# Patient Record
Sex: Female | Born: 1940
Health system: Southern US, Community
[De-identification: ages and names within clinical notes are randomized; demographics above are authoritative.]

## PROBLEM LIST (undated history)

## (undated) DIAGNOSIS — T7840XA Allergy, unspecified, initial encounter: Secondary | ICD-10-CM

## (undated) DIAGNOSIS — N200 Calculus of kidney: Secondary | ICD-10-CM

## (undated) HISTORY — PX: BUNIONECTOMY: SHX129

## (undated) HISTORY — DX: Calculus of kidney: N20.0

## (undated) HISTORY — PX: CATARACT EXTRACTION: SUR2

## (undated) HISTORY — PX: ABDOMINAL HYSTERECTOMY: SHX81

## (undated) HISTORY — PX: TONSILLECTOMY: SUR1361

## (undated) HISTORY — DX: Allergy, unspecified, initial encounter: T78.40XA

---

## 2000-03-23 ENCOUNTER — Encounter: Payer: Self-pay | Admitting: Family Medicine

## 2000-03-23 ENCOUNTER — Encounter: Admission: RE | Admit: 2000-03-23 | Discharge: 2000-03-23 | Payer: Self-pay | Admitting: Family Medicine

## 2000-08-28 ENCOUNTER — Encounter: Payer: Self-pay | Admitting: Family Medicine

## 2000-08-28 ENCOUNTER — Encounter: Admission: RE | Admit: 2000-08-28 | Discharge: 2000-08-28 | Payer: Self-pay | Admitting: Family Medicine

## 2001-08-30 ENCOUNTER — Encounter: Admission: RE | Admit: 2001-08-30 | Discharge: 2001-08-30 | Payer: Self-pay | Admitting: Family Medicine

## 2001-08-30 ENCOUNTER — Encounter: Payer: Self-pay | Admitting: Family Medicine

## 2002-09-03 ENCOUNTER — Encounter: Payer: Self-pay | Admitting: Family Medicine

## 2002-09-03 ENCOUNTER — Encounter: Admission: RE | Admit: 2002-09-03 | Discharge: 2002-09-03 | Payer: Self-pay | Admitting: Family Medicine

## 2003-09-08 ENCOUNTER — Encounter: Admission: RE | Admit: 2003-09-08 | Discharge: 2003-09-08 | Payer: Self-pay | Admitting: Family Medicine

## 2004-04-15 ENCOUNTER — Encounter: Admission: RE | Admit: 2004-04-15 | Discharge: 2004-04-15 | Payer: Self-pay | Admitting: Family Medicine

## 2004-04-19 ENCOUNTER — Encounter: Admission: RE | Admit: 2004-04-19 | Discharge: 2004-04-19 | Payer: Self-pay | Admitting: Family Medicine

## 2004-05-25 ENCOUNTER — Ambulatory Visit: Payer: Self-pay | Admitting: Family Medicine

## 2004-08-06 ENCOUNTER — Ambulatory Visit: Payer: Self-pay | Admitting: Family Medicine

## 2004-09-07 ENCOUNTER — Ambulatory Visit: Payer: Self-pay | Admitting: Family Medicine

## 2004-09-16 ENCOUNTER — Encounter: Admission: RE | Admit: 2004-09-16 | Discharge: 2004-09-16 | Payer: Self-pay | Admitting: Family Medicine

## 2004-09-17 ENCOUNTER — Ambulatory Visit: Payer: Self-pay | Admitting: Family Medicine

## 2005-01-08 ENCOUNTER — Emergency Department (HOSPITAL_COMMUNITY): Admission: EM | Admit: 2005-01-08 | Discharge: 2005-01-09 | Payer: Self-pay | Admitting: Emergency Medicine

## 2005-01-12 ENCOUNTER — Ambulatory Visit: Payer: Self-pay | Admitting: Family Medicine

## 2005-01-12 ENCOUNTER — Ambulatory Visit: Payer: Self-pay | Admitting: Cardiology

## 2005-01-12 ENCOUNTER — Encounter (INDEPENDENT_AMBULATORY_CARE_PROVIDER_SITE_OTHER): Payer: Self-pay | Admitting: *Deleted

## 2005-01-13 ENCOUNTER — Ambulatory Visit: Payer: Self-pay | Admitting: Family Medicine

## 2005-01-17 ENCOUNTER — Ambulatory Visit: Payer: Self-pay | Admitting: Internal Medicine

## 2005-02-21 ENCOUNTER — Ambulatory Visit: Payer: Self-pay | Admitting: Internal Medicine

## 2005-03-03 ENCOUNTER — Ambulatory Visit: Payer: Self-pay | Admitting: Internal Medicine

## 2005-03-17 ENCOUNTER — Ambulatory Visit: Payer: Self-pay | Admitting: Family Medicine

## 2005-09-19 ENCOUNTER — Encounter: Admission: RE | Admit: 2005-09-19 | Discharge: 2005-09-19 | Payer: Self-pay | Admitting: Family Medicine

## 2005-09-19 ENCOUNTER — Ambulatory Visit: Payer: Self-pay | Admitting: Family Medicine

## 2006-03-16 ENCOUNTER — Ambulatory Visit: Payer: Self-pay | Admitting: Family Medicine

## 2006-03-20 ENCOUNTER — Ambulatory Visit: Payer: Self-pay | Admitting: Internal Medicine

## 2006-04-26 ENCOUNTER — Ambulatory Visit: Payer: Self-pay | Admitting: Family Medicine

## 2006-04-28 ENCOUNTER — Encounter: Admission: RE | Admit: 2006-04-28 | Discharge: 2006-04-28 | Payer: Self-pay | Admitting: Otolaryngology

## 2006-07-03 ENCOUNTER — Ambulatory Visit: Payer: Self-pay | Admitting: Family Medicine

## 2006-08-08 ENCOUNTER — Ambulatory Visit: Payer: Self-pay | Admitting: Family Medicine

## 2006-09-22 ENCOUNTER — Encounter: Admission: RE | Admit: 2006-09-22 | Discharge: 2006-09-22 | Payer: Self-pay | Admitting: Family Medicine

## 2006-10-03 ENCOUNTER — Encounter: Admission: RE | Admit: 2006-10-03 | Discharge: 2006-10-03 | Payer: Self-pay | Admitting: Family Medicine

## 2007-02-23 ENCOUNTER — Ambulatory Visit: Payer: Self-pay | Admitting: Family Medicine

## 2007-02-28 DIAGNOSIS — G43909 Migraine, unspecified, not intractable, without status migrainosus: Secondary | ICD-10-CM | POA: Insufficient documentation

## 2007-02-28 DIAGNOSIS — J309 Allergic rhinitis, unspecified: Secondary | ICD-10-CM | POA: Insufficient documentation

## 2007-02-28 DIAGNOSIS — M810 Age-related osteoporosis without current pathological fracture: Secondary | ICD-10-CM | POA: Insufficient documentation

## 2007-03-20 ENCOUNTER — Ambulatory Visit: Payer: Self-pay | Admitting: Family Medicine

## 2007-03-20 LAB — CONVERTED CEMR LAB
ALT: 21 units/L (ref 0–35)
Albumin: 4.2 g/dL (ref 3.5–5.2)
Alkaline Phosphatase: 59 units/L (ref 39–117)
BUN: 15 mg/dL (ref 6–23)
Basophils Relative: 0.5 % (ref 0.0–1.0)
CO2: 31 meq/L (ref 19–32)
Calcium: 9.5 mg/dL (ref 8.4–10.5)
Cholesterol: 233 mg/dL (ref 0–200)
Eosinophils Relative: 2 % (ref 0.0–5.0)
GFR calc Af Amer: 129 mL/min
GFR calc non Af Amer: 106 mL/min
Hemoglobin: 12.4 g/dL (ref 12.0–15.0)
Lymphocytes Relative: 20.1 % (ref 12.0–46.0)
Monocytes Relative: 6.6 % (ref 3.0–11.0)
Neutrophils Relative %: 70.8 % (ref 43.0–77.0)
VLDL: 21 mg/dL (ref 0–40)

## 2007-03-21 ENCOUNTER — Telehealth: Payer: Self-pay | Admitting: Family Medicine

## 2007-03-22 ENCOUNTER — Encounter: Payer: Self-pay | Admitting: Family Medicine

## 2007-05-03 ENCOUNTER — Ambulatory Visit: Payer: Self-pay | Admitting: Family Medicine

## 2007-10-03 ENCOUNTER — Encounter: Admission: RE | Admit: 2007-10-03 | Discharge: 2007-10-03 | Payer: Self-pay | Admitting: Family Medicine

## 2007-10-17 ENCOUNTER — Ambulatory Visit: Payer: Self-pay | Admitting: Family Medicine

## 2007-10-25 ENCOUNTER — Ambulatory Visit: Payer: Self-pay | Admitting: Family Medicine

## 2007-11-16 ENCOUNTER — Ambulatory Visit: Payer: Self-pay | Admitting: Family Medicine

## 2008-03-18 ENCOUNTER — Ambulatory Visit: Payer: Self-pay | Admitting: Family Medicine

## 2008-03-18 DIAGNOSIS — D1800 Hemangioma unspecified site: Secondary | ICD-10-CM | POA: Insufficient documentation

## 2008-03-18 LAB — CONVERTED CEMR LAB
ALT: 19 units/L (ref 0–35)
Basophils Absolute: 0 10*3/uL (ref 0.0–0.1)
Bilirubin Urine: NEGATIVE
Bilirubin, Direct: 0.2 mg/dL (ref 0.0–0.3)
CO2: 30 meq/L (ref 19–32)
Calcium: 9.4 mg/dL (ref 8.4–10.5)
Cholesterol: 229 mg/dL (ref 0–200)
Direct LDL: 163.1 mg/dL
Eosinophils Absolute: 0.1 10*3/uL (ref 0.0–0.7)
GFR calc non Af Amer: 106 mL/min
Glucose, Urine, Semiquant: NEGATIVE
HDL: 37.3 mg/dL — ABNORMAL LOW (ref >39.0)
Hemoglobin: 12.7 g/dL (ref 12.0–15.0)
Ketones, urine, test strip: NEGATIVE
Lymphocytes Relative: 22.4 % (ref 12.0–46.0)
MCHC: 34.7 g/dL (ref 30.0–36.0)
MCV: 84.7 fL (ref 78.0–100.0)
Neutro Abs: 3.4 10*3/uL (ref 1.4–7.7)
Neutrophils Relative %: 67.4 % (ref 43.0–77.0)
Protein, U semiquant: NEGATIVE
RDW: 13.4 % (ref 11.5–14.6)
Sodium: 144 meq/L (ref 135–145)
Specific Gravity, Urine: 1.02
TSH: 1.76 microintl units/mL (ref 0.35–5.50)
Total Bilirubin: 1.9 mg/dL — ABNORMAL HIGH (ref 0.3–1.2)
VLDL: 27 mg/dL (ref 0–40)
pH: 6

## 2008-03-31 ENCOUNTER — Encounter: Payer: Self-pay | Admitting: Family Medicine

## 2008-04-01 ENCOUNTER — Ambulatory Visit: Payer: Self-pay | Admitting: Family Medicine

## 2008-04-18 ENCOUNTER — Ambulatory Visit: Payer: Self-pay | Admitting: Internal Medicine

## 2008-04-18 ENCOUNTER — Encounter: Payer: Self-pay | Admitting: Family Medicine

## 2008-09-16 ENCOUNTER — Ambulatory Visit: Payer: Self-pay | Admitting: Family Medicine

## 2008-10-01 ENCOUNTER — Telehealth: Payer: Self-pay | Admitting: Family Medicine

## 2008-10-03 ENCOUNTER — Encounter: Admission: RE | Admit: 2008-10-03 | Discharge: 2008-10-03 | Payer: Self-pay | Admitting: Family Medicine

## 2008-12-15 ENCOUNTER — Ambulatory Visit: Payer: Self-pay | Admitting: Internal Medicine

## 2009-01-19 ENCOUNTER — Ambulatory Visit: Payer: Self-pay | Admitting: Family Medicine

## 2009-02-05 ENCOUNTER — Ambulatory Visit: Payer: Self-pay | Admitting: Family Medicine

## 2009-03-20 ENCOUNTER — Ambulatory Visit: Payer: Self-pay | Admitting: Family Medicine

## 2009-03-23 ENCOUNTER — Telehealth (INDEPENDENT_AMBULATORY_CARE_PROVIDER_SITE_OTHER): Payer: Self-pay | Admitting: *Deleted

## 2009-05-04 ENCOUNTER — Ambulatory Visit: Payer: Self-pay | Admitting: Family Medicine

## 2009-05-07 ENCOUNTER — Ambulatory Visit: Payer: Self-pay | Admitting: Family Medicine

## 2009-05-11 ENCOUNTER — Ambulatory Visit: Payer: Self-pay | Admitting: Family Medicine

## 2009-05-11 ENCOUNTER — Telehealth: Payer: Self-pay | Admitting: Family Medicine

## 2009-09-22 ENCOUNTER — Ambulatory Visit: Payer: Self-pay | Admitting: Family Medicine

## 2009-09-22 LAB — CONVERTED CEMR LAB
ALT: 25 units/L (ref 0–35)
AST: 31 units/L (ref 0–37)
Albumin: 4.1 g/dL (ref 3.5–5.2)
Basophils Relative: 0.9 % (ref 0.0–3.0)
Chloride: 104 meq/L (ref 96–112)
Eosinophils Relative: 2.9 % (ref 0.0–5.0)
GFR calc non Af Amer: 105.37 mL/min (ref >60)
HCT: 36.7 % (ref 36.0–46.0)
Hemoglobin: 12.1 g/dL (ref 12.0–15.0)
Lymphs Abs: 1.5 10*3/uL (ref 0.7–4.0)
MCV: 85.5 fL (ref 78.0–100.0)
Monocytes Absolute: 0.4 10*3/uL (ref 0.1–1.0)
Monocytes Relative: 7.8 % (ref 3.0–12.0)
Neutro Abs: 3.5 10*3/uL (ref 1.4–7.7)
Potassium: 5.3 meq/L — ABNORMAL HIGH (ref 3.5–5.1)
Sodium: 144 meq/L (ref 135–145)
Total Protein: 7.8 g/dL (ref 6.0–8.3)
WBC: 5.7 10*3/uL (ref 4.5–10.5)

## 2009-09-23 ENCOUNTER — Ambulatory Visit: Payer: Self-pay | Admitting: Cardiovascular Disease

## 2009-09-24 ENCOUNTER — Telehealth: Payer: Self-pay | Admitting: Family Medicine

## 2009-09-24 ENCOUNTER — Telehealth: Payer: Self-pay | Admitting: Internal Medicine

## 2009-09-25 ENCOUNTER — Telehealth: Payer: Self-pay | Admitting: Family Medicine

## 2009-10-05 ENCOUNTER — Encounter: Admission: RE | Admit: 2009-10-05 | Discharge: 2009-10-05 | Payer: Self-pay | Admitting: Family Medicine

## 2010-03-16 ENCOUNTER — Telehealth: Payer: Self-pay | Admitting: Internal Medicine

## 2010-03-16 ENCOUNTER — Ambulatory Visit: Payer: Self-pay | Admitting: Family Medicine

## 2010-03-18 ENCOUNTER — Ambulatory Visit: Payer: Self-pay | Admitting: Internal Medicine

## 2010-03-19 ENCOUNTER — Ambulatory Visit (HOSPITAL_COMMUNITY): Admission: RE | Admit: 2010-03-19 | Discharge: 2010-03-19 | Payer: Self-pay | Admitting: Internal Medicine

## 2010-03-19 ENCOUNTER — Ambulatory Visit: Payer: Self-pay | Admitting: Gastroenterology

## 2010-03-19 LAB — CONVERTED CEMR LAB
ALT: 15 units/L (ref 0–35)
Basophils Absolute: 0 10*3/uL (ref 0.0–0.1)
CO2: 26 meq/L (ref 19–32)
GFR calc non Af Amer: 76.6 mL/min (ref >60)
HCT: 40.7 % (ref 36.0–46.0)
HDL: 41.9 mg/dL (ref >39.00)
Ketones, ur: >=80 mg/dL
Lymphocytes Relative: 22.9 % (ref 12.0–46.0)
Lymphs Abs: 1.7 10*3/uL (ref 0.7–4.0)
Monocytes Relative: 7.6 % (ref 3.0–12.0)
Neutrophils Relative %: 67.4 % (ref 43.0–77.0)
Platelets: 246 10*3/uL (ref 150.0–400.0)
Potassium: 6.5 meq/L (ref 3.5–5.1)
RDW: 14.2 % (ref 11.5–14.6)
Sodium: 140 meq/L (ref 135–145)
Specific Gravity, Urine: 1.01 (ref 1.000–1.030)
TSH: 1.18 microintl units/mL (ref 0.35–5.50)
Total Bilirubin: 2.5 mg/dL — ABNORMAL HIGH (ref 0.3–1.2)
Total Protein: 8 g/dL (ref 6.0–8.3)
Triglycerides: 111 mg/dL (ref 0.0–149.0)
Urine Glucose: NEGATIVE mg/dL
pH: 6 (ref 5.0–8.0)

## 2010-03-22 ENCOUNTER — Encounter (INDEPENDENT_AMBULATORY_CARE_PROVIDER_SITE_OTHER): Payer: Self-pay | Admitting: *Deleted

## 2010-03-22 ENCOUNTER — Ambulatory Visit: Payer: Self-pay | Admitting: Internal Medicine

## 2010-03-23 ENCOUNTER — Encounter: Payer: Self-pay | Admitting: Family Medicine

## 2010-03-23 ENCOUNTER — Ambulatory Visit: Payer: Self-pay | Admitting: Family Medicine

## 2010-03-24 LAB — CONVERTED CEMR LAB
AST: 20 units/L (ref 0–37)
Alkaline Phosphatase: 68 units/L (ref 39–117)
Direct LDL: 150.4 mg/dL
GFR calc non Af Amer: 94.26 mL/min (ref >60)
HDL: 39.8 mg/dL (ref >39.00)
Potassium: 5.4 meq/L — ABNORMAL HIGH (ref 3.5–5.1)
Sodium: 144 meq/L (ref 135–145)
Total Bilirubin: 1.5 mg/dL — ABNORMAL HIGH (ref 0.3–1.2)
VLDL: 23.2 mg/dL (ref 0.0–40.0)

## 2010-04-28 ENCOUNTER — Ambulatory Visit: Payer: Self-pay | Admitting: Internal Medicine

## 2010-04-28 DIAGNOSIS — K222 Esophageal obstruction: Secondary | ICD-10-CM | POA: Insufficient documentation

## 2010-04-28 DIAGNOSIS — K219 Gastro-esophageal reflux disease without esophagitis: Secondary | ICD-10-CM | POA: Insufficient documentation

## 2010-07-15 ENCOUNTER — Ambulatory Visit
Admission: RE | Admit: 2010-07-15 | Discharge: 2010-07-15 | Payer: Self-pay | Source: Home / Self Care | Attending: Family Medicine | Admitting: Family Medicine

## 2010-07-25 LAB — CONVERTED CEMR LAB
AST: 22 units/L (ref 0–37)
Albumin: 4.3 g/dL (ref 3.5–5.2)
Alkaline Phosphatase: 54 units/L (ref 39–117)
BUN: 20 mg/dL (ref 6–23)
Basophils Absolute: 0 10*3/uL (ref 0.0–0.1)
Bilirubin Urine: NEGATIVE
CO2: 30 meq/L (ref 19–32)
Calcium: 9.5 mg/dL (ref 8.4–10.5)
Cholesterol: 210 mg/dL — ABNORMAL HIGH (ref 0–200)
Creatinine, Ser: 0.7 mg/dL (ref 0.4–1.2)
Eosinophils Absolute: 0.1 10*3/uL (ref 0.0–0.7)
GFR calc non Af Amer: 88.33 mL/min (ref >60)
Glucose, Bld: 93 mg/dL (ref 70–99)
Glucose, Urine, Semiquant: NEGATIVE
HDL: 37.5 mg/dL — ABNORMAL LOW (ref >39.00)
Hemoglobin: 12.3 g/dL (ref 12.0–15.0)
Lymphocytes Relative: 24 % (ref 12.0–46.0)
MCHC: 33.3 g/dL (ref 30.0–36.0)
Monocytes Relative: 8.2 % (ref 3.0–12.0)
Neutro Abs: 3.5 10*3/uL (ref 1.4–7.7)
Nitrite: NEGATIVE
Platelets: 256 10*3/uL (ref 150.0–400.0)
RDW: 13.5 % (ref 11.5–14.6)
Specific Gravity, Urine: 1.025
TSH: 1.06 microintl units/mL (ref 0.35–5.50)
Total Bilirubin: 2.4 mg/dL — ABNORMAL HIGH (ref 0.3–1.2)
Triglycerides: 81 mg/dL (ref 0.0–149.0)
pH: 5.5

## 2010-07-29 NOTE — Procedures (Signed)
Summary: Upper Endoscopy  Patient: Barbara Pittman Note: All result statuses are Final unless otherwise noted.  Tests: (1) Upper Endoscopy (EGD)   EGD Upper Endoscopy       DONE     [facility]     [address]     [place], [place]  [address]           ENDOSCOPY PROCEDURE REPORT           PATIENT:  Barbara Pittman, Barbara Pittman  MR#:  [id number]     BIRTHDATE:  1940/07/02, 69 yrs. old  GENDER:  female           ENDOSCOPIST:  Wilhemina Bonito. Eda Keys, MD     Referred by:  Eugenio Hoes Tawanna Cooler, M.D.           PROCEDURE DATE:  03/22/2010     PROCEDURE:  EGD with biopsy,     Maloney Dilation of Esophagus -     28F     ASA CLASS:  Class II     INDICATIONS:  abdominal pain, dysphagia           MEDICATIONS:   Fentanyl 25 mcg IV, Versed 4 mg IV     TOPICAL ANESTHETIC:  Exactacain Spray           DESCRIPTION OF PROCEDURE:   After the risks benefits and     alternatives of the procedure were thoroughly explained, informed     consent was obtained.  The LB GIF-H180 [id number] endoscope was     introduced through the mouth and advanced to the second portion of     the duodenum, without limitations.  The instrument was slowly     withdrawn as the mucosa was fully examined.     <<PROCEDUREIMAGES>>           Mild Esophagitis was found in the distal esophagus. No Barrett's.     A large caliber ring-like 15mm stricture was found in the distal     esophagus.  Multiple small erosions were found in the body of the     stomach. CLO bx taken. The duodenal bulb was normal in appearance,     as was the postbulbar duodenum.    Retroflexed views revealed no     abnormalities.    The scope was then withdrawn from the patient     and the procedure completed.           THERAPY: 33f MALONEY DILATOR PASSED W/O RESITANCE OR HEME           COMPLICATIONS:  None           ENDOSCOPIC IMPRESSION:     1) Mild Esophagitis in the distal esophagus     2) Large caliber Stricture in the distal esophagus - s/p     dilation 28F   3) Erosions, multiple small in the body of the stomach     4) Normal duodenum     5) GERD           RECOMMENDATIONS:     1) Clear liquids until 11am, then soft foods rest of day. Resume     prior diet tomorrow.     2) OMEPRAZOLE 40MG  PO QD; #30; 11 REFILLS     3) Rx CLO if positive     4) Call office next 2-3 days to schedule an office appointment     for 4-6 WEEKS           ______________________________  Wilhemina Bonito. Eda Keys, MD           CC:  The Patient, Roderick Pee, MD           n.     Rosalie DoctorWilhemina Bonito. Eda Keys at 03/22/2010 08:41 AM           Conan Bowens, [phone]  Note: An exclamation mark (!) indicates a result that was not dispersed into the flowsheet. Document Creation Date: 03/22/2010 8:41 AM _______________________________________________________________________  (1) Order result status: Final Collection or observation date-time: 03/22/2010 08:23 Requested date-time:  Receipt date-time:  Reported date-time:  Referring Physician:   Ordering Physician: Fransico Setters [phone]) Specimen Source:  Source: Launa Grill Order Number: [id number] Lab site:   Appended Document: Upper Endoscopy letter mailed to pt with follow up appt

## 2010-07-29 NOTE — Assessment & Plan Note (Signed)
Summary: emp/pt fasting/cjr   Vital Signs:  Patient profile:   70 year old female Menstrual status:  hysterectomy Height:      60 inches Weight:      106 pounds BMI:     20.78 Temp:     98.2 degrees F oral BP sitting:   110 / 78  (left arm) Cuff size:   regular  Vitals Entered By: Kern Reap CMA Duncan Dull) (March 23, 2010 9:31 AM) CC: annual wellness exam Is Patient Diabetic? No Pain Assessment Patient in pain? no        Primary Care Provider:  Kelle Darting, MD  CC:  annual wellness exam.  History of Present Illness: Barbara Pittman is a 70 year old single female, who comes in today for Medicare wellness exam,  She's always been in excellent health.  She said no chronic health problems except for occasional migraine headache.  She takes Imitrex with good relief.  She was recently started on Prilosec 40 mg daily by Dr. Marina Goodell.  We saw her with symptoms of dysphagia.  Endoscopy revealed a stricture, which was dilated by Dr. Marina Goodell.  She tolerated the procedure well and had no major side effects.  Advised to stay off aspirin and calcium in the future.  Also.  She gets routine eye care, dental care, BSE monthly, annual mammography, colonoscopy, normal.  GI, tetanus, 2003, seasonal flu 2011, Pneumovax 2007, shingles 2009. Here for Medicare AWV:  70   Risk factors based on Past M, S, F history:...reviewed in detail.  Normal except for esophageal dilatation as noted above 2.   Physical Activities: walks daily 3.   Depression/mood: good mood.  No depression 4.   Hearing: recently got a hearing aid because of hearing loss 5.   ADL's: functions normally independently 6.   Fall Risk: we did none 7.   Home Safety: revealed no guns in the house 8.   Height, weight, &visual acuity:height weight, normal.  Vision normal 9.   Counseling: continue current therapy except stop all aspirin and aspirin products and no calcium tablets.  Because of size 10.   Labs ordered based on risk factors: done  today 11.           Referral Coordination.........none indicated 12.           Care Plan......Marland Kitchenreviewed in detail with med changes is noted  13.            Cognitive Assessment ...oriented x 3 does her own financial work  Allergies: 1)  ! Macrodantin 2)  ! Keflex  Past History:  Past medical, surgical, family and social histories (including risk factors) reviewed, and no changes noted (except as noted below).  Past Medical History: Reviewed history from 03/18/2010 and no changes required. Allergic rhinitis Osteoporosis migraine headache fractured left fifth metatarsal 09 Kidney Stones  Past Surgical History: Reviewed history from 02/28/2007 and no changes required. CB X2 Hysterectomy bunionectomy  Family History: Reviewed history from 03/18/2010 and no changes required. mother had leukemia brother with tonsillar cancer, told it was HPV another brother with bladder cancer Family History of Pancreatic Cancer:Aunt Family History of Diabetes: Brother Family History of Heart Disease: Paternal Uncles  Social History: Reviewed history from 03/18/2010 and no changes required. 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 No apparent alopecia or balding. Eyes:  No corneal or conjunctival inflammation noted. EOMI. Perrla. Funduscopic exam benign, without hemorrhages, exudates or papilledema. Vision grossly normal. Ears:  External ear exam shows no significant lesions or deformities.  Otoscopic examination reveals clear canals, tympanic membranes are intact bilaterally without bulging, retraction, inflammation or discharge. Hearing is grossly normal bilaterally. Nose:  External nasal  examination shows no deformity or inflammation. Nasal mucosa are pink and moist without lesions or exudates. Mouth:  Oral mucosa and oropharynx without lesions or exudates.  Teeth in good repair. Neck:  No deformities, masses, or tenderness noted. Chest Wall:  No deformities, masses, or tenderness noted. Breasts:  No mass, nodules, thickening, tenderness, bulging, retraction, inflamation, nipple discharge or skin changes noted.   Lungs:  Normal respiratory effort, chest expands symmetrically. Lungs are clear to auscultation, no crackles or wheezes. Heart:  Normal rate and regular rhythm. S1 and S2 normal without gallop, murmur, click, rub or other extra sounds. Abdomen:  Bowel sounds positive,abdomen soft and non-tender without masses, organomegaly or hernias noted. Rectal:  No external abnormalities noted. Normal sphincter tone. No rectal masses or tenderness. Genitalia:  Pelvic Exam:        External: normal female genitalia without lesions or masses        Vagina: normal without lesions or masses        Cervix: normal without lesions or masses        Adnexa: normal bimanual exam without masses or fullness        Uterus: normal by palpation        Pap smear: not performed Msk:  No deformity or scoliosis noted of thoracic or lumbar spine.   Pulses:  R and L carotid,radial,femoral,dorsalis pedis and posterior tibial pulses are full and equal bilaterally Extremities:  No clubbing, cyanosis, edema, or deformity noted with normal full range of motion of all joints.   Neurologic:  No cranial nerve deficits noted. Station and gait are normal. Plantar reflexes are down-going bilaterally. DTRs are symmetrical throughout. Sensory, motor and coordinative functions appear intact. Skin:  Intact without suspicious lesions or rashes Cervical Nodes:  No lymphadenopathy noted Axillary Nodes:  No palpable lymphadenopathy Inguinal Nodes:  No significant adenopathy Psych:  Cognition and judgment appear intact.  Alert and cooperative with normal attention span and concentration. No apparent delusions, illusions, hallucinations   Impression & Recommendations:  Problem # 1:  MIGRAINE HEADACHE (ICD-346.90) Assessment Unchanged  Her updated medication list for this problem includes:    Imitrex 50 Mg Tabs (Sumatriptan succinate) .Marland Kitchen... As needed    Aspirin 81 Mg Tbec (Aspirin) ..... Once daily  Orders: Prescription Created Electronically [phone]) Medicare -1st Annual Wellness Visit [phone])  Problem # 2:  HEALTH SCREENING (ICD-V70.0) Assessment: Unchanged  Orders: Prescription Created Electronically [phone]) Medicare -1st Annual Wellness Visit [phone])  Problem # 3:  OSTEOPOROSIS (ICD-733.00) Assessment: Unchanged  Orders: Prescription Created Electronically [phone]) Medicare -1st Annual Wellness Visit [phone])  Complete Medication List: 1)  Imitrex 50 Mg Tabs (Sumatriptan succinate) .... As needed 2)  Aspirin 81 Mg Tbec (Aspirin) .... Once daily 3)  Fish Oil 1000 Mg Caps (Omega-3 fatty acids) .... Once daily 4)  Benadryl 25 Mg Tabs (Diphenhydramine hcl) .... As needed 5)  Omeprazole 40 Mg Cpdr (Omeprazole) .Marland Kitchen.. 1 each day 30 minutes before meal 6)  Calcium 600 Mg Tabs (Calcium) .... Take in tab once daily  Other Orders: Venipuncture (57846) Specimen Handling (96295) TLB-Lipid Panel (80061-LIPID) TLB-BMP (Basic Metabolic Panel-BMET) (80048-METABOL)  TLB-Hepatic/Liver Function Pnl (80076-HEPATIC)  Patient Instructions: 1)  because of the esophageal stricture noted.  Recommend he stop all aspirin and aspirin products and only take Tylenol.  Also stop the calcium tablets.  Walk 30 minutes daily to maintain good bone strength 2)  Please schedule a follow-up appointment in 1 year. Prescriptions: IMITREX 50 MG  TABS (SUMATRIPTAN SUCCINATE) as needed  #6 x 11   Entered and Authorized by:   Roderick Pee MD   Signed by:   Roderick Pee MD on 03/23/2010   Method used:   Electronically to         [name] [address] (retail)       [address]       [place], [place]  [address]       Ph: [phone]       Fax: [phone]   RxID:   [id number] OMEPRAZOLE 40 MG  CPDR (OMEPRAZOLE) 1 each day 30 minutes before meal  #100 x 3   Entered and Authorized by:   Roderick Pee MD   Signed by:   Roderick Pee MD on 03/23/2010   Method used:   Electronically to        [name] [address] (retail)       [address]       [place], [place]  [address]       Ph: [phone]       Fax: [phone]   RxID:   [id number]

## 2010-07-29 NOTE — Progress Notes (Signed)
Summary: Appointment  Phone Note Call from Patient Call back at Home Phone [phone]   Caller: Patient Reason for Call: Talk to Nurse Summary of Call: Patient thinks problem is better - should she cancel her appointment with Dr. Marina Goodell for Monday 8:30am?? Initial call taken by: Everrett Coombe,  September 25, 2009 2:32 PM  Follow-up for Phone Call        if she is 100% asymptomatic and then canceled appointment is not see Dr. Marina Goodell on Monday Follow-up by: Roderick Pee MD,  September 25, 2009 3:12 PM  Additional Follow-up for Phone Call Additional follow up Details #1::        Phone Call Completed Additional Follow-up by: Kern Reap CMA Duncan Dull),  September 25, 2009 3:30 PM

## 2010-07-29 NOTE — Progress Notes (Signed)
Summary: triage  Phone Note Call from Patient Call back at Home Phone [phone]   Caller: Patient Call For: Marina Goodell Reason for Call: Talk to Nurse Summary of Call: Patient wants to see Dr Marina Goodell asap because she is severely bloated ("unable to zipper pant") Initial call taken by: Tawni Levy,  September 24, 2009 9:37 AM  Follow-up for Phone Call        Pt. saw Dr.Todd yesterday and is referred by him.CT was neg.Can't come Tuesday when Dr.San Rua is supervising so given appt. for Monday with PA. Follow-up by: Teryl Lucy RN,  September 24, 2009 10:24 AM

## 2010-07-29 NOTE — Assessment & Plan Note (Signed)
Summary: ? gerd//ccm   Vital Signs:  Patient profile:   70 year old female Menstrual status:  hysterectomy Weight:      109 pounds Temp:     98.9 degrees F oral BP sitting:   102 / 72  (left arm) Cuff size:   regular  Vitals Entered By: Kern Reap CMA Duncan Dull) (March 16, 2010 3:48 PM) CC: heartburn   CC:  heartburn.  History of Present Illness: Barbara Pittman is a 70 y/o fem nonsmoker who comes in today with a 3 month history of difficulty swallowing.  It started last July.  Over the last week to 10 days gotten worse.  Today, she cannot even swallow soft foods.  He got stuck in her mid esophagus.  She's had no vomiting.  No history of previous reflux in the past  Allergies: 1)  ! Macrodantin 2)  ! Keflex  Past History:  Past medical, surgical, family and social histories (including risk factors) reviewed for relevance to current acute and chronic problems.  Past Medical History: Reviewed history from 11/16/2007 and no changes required. Allergic rhinitis Osteoporosis migraine headache fractured left fifth metatarsal 09  Past Surgical History: Reviewed history from 02/28/2007 and no changes required. CB X2 Hysterectomy bunionectomy  Family History: Reviewed history from 03/18/2008 and no changes required. mother had leukemia brother with tonsillar cancer, told it was HPV another brother with bladder cancer  Social History: Reviewed history from 03/20/2007 and no changes required. Single Never Smoked Alcohol use-no Regular exercise-yes  Review of Systems      See HPI       Flu Vaccine Consent Questions     Do you have a history of severe allergic reactions to this vaccine? no    Any prior history of allergic reactions to egg and/or gelatin? no    Do you have a sensitivity to the preservative Thimersol? no    Do you have a past history of Guillan-Barre Syndrome? no    Do you currently have an acute febrile illness? no    Have you ever had a severe reaction to  latex? no    Vaccine information given and explained to patient? yes    Are you currently pregnant? no    Lot Number:[id number]   Exp Date:12/25/2010   Site Given  Left Deltoid IM   Physical Exam  General:  Well-developed,well-nourished,in no acute distress; alert,appropriate and cooperative throughout examination Abdomen:  Bowel sounds positive,abdomen soft and non-tender without masses, organomegaly or hernias noted.   Problems:  Medical Problems Added: 1)  Dx of Dysphagia  (ICD-787.20)  Impression & Recommendations:  Problem # 1:  DYSPHAGIA (ICD-787.20) Assessment New  Complete Medication List: 1)  Imitrex 50 Mg Tabs (Sumatriptan succinate) .... As needed 2)  Ca &vit D  3)  Flonase 50 Mcg/act Susp (Fluticasone propionate) .... One shot r & l nose at bedtime  Other Orders: Flu Vaccine 68yrs + MEDICARE PATIENTS (U9811) Administration Flu vaccine - MCR (B1478)  Patient Instructions: 1)  stay on a liquid diet Dr. Lamar Sprinkles office will call you tonight or tomorrowto get you  set up for consult this week

## 2010-07-29 NOTE — Progress Notes (Signed)
  Phone Note Outgoing Call   Summary of Call: CT scan looks normal.  Patient still symptomatic refer to GI for further evaluation Initial call taken by: Roderick Pee MD,  September 24, 2009 9:42 AM

## 2010-07-29 NOTE — Miscellaneous (Signed)
Summary: Orders Update  Clinical Lists Changes  Problems: Added new problem of GASTRITIS (ICD-535.50) Orders: Added new Test order of TLB-H Pylori Screen Gastric Biopsy (83013-CLOTEST) - Signed 

## 2010-07-29 NOTE — Progress Notes (Signed)
Summary: New onset dysphagia / needs GI appointment  Phone Note From Other Clinic   Caller: Dr. Tawanna Cooler Call For: Dr. Marina Goodell Details for Reason: dysphagia Summary of Call: Dr. Tawanna Cooler called regarding his patient. He would likeher seen soon for problems with dysphagia. Please overbook her on my schedule this Thursday at 11:30 AM. Thanks Initial call taken by: Hilarie Fredrickson MD,  March 16, 2010 4:36 PM  Follow-up for Phone Call        Pt. will see Dr.Perry on 03-18-10 at 11:30am. Pt. advised of med.list/co-pay/cx.policy Follow-up by: Laureen Ochs LPN,  March 16, 2010 5:03 PM

## 2010-07-29 NOTE — Assessment & Plan Note (Signed)
Summary: Abdominal discomfort / dysphagia   History of Present Illness Visit Type: Initial Consult Primary GI MD: Yancey Flemings MD Primary Provider: Kelle Darting, MD Requesting Provider: Epimenio Foot, MD Chief Complaint: abdominal discomfort and fullness with meals History of Present Illness:   70 year old female with a history of migraine headaches, kidney stones, and allergic rhinitis. She is sent today regarding recurrent abdominal pain and questionable dysphagia. I saw her once in September 2006 for routine colonoscopy after recovering from a bout of diverticulitis. Examination revealed sigmoid diverticulosis. No polyps or other abnormalities. She now reports problems with recurrent dull normal discomfort that began in July 2011. Occasional pyrosis. The discomfort originates in the epigastric region and radiates into the chest, lower abdomen, as well as upper abdomen bilaterally. No associated nausea, vomiting, or fevers. No radiation into the back. Food does not affect the symptom. She does describe a sensation of fullness after meals, though not true esophageal dysphagia. She has purchased over-the-counter PPI. She takes aspirin. The discomfort occurs sporadically and tends to last for hours or all day. It can occur several consecutive days, or she may go greater than one-week with no symptoms. She has been awoken occasionally with the discomfort. Review of outside records finds that she was evaluated in March 2011 for abdominal bloating discomfort. CBC and comprehensive metabolic panel were normal. CT Scan of the abdomen and pelvis revealed no acute abnormalities. Incidental findings were diverticulosis, possible rectal prolapse, nonobstructive nephrolithiasis, and small right femoral hernia.   GI Review of Systems    Reports acid reflux, bloating, and  dysphagia with solids.      Denies abdominal pain, belching, chest pain, dysphagia with liquids, heartburn, loss of appetite, nausea, vomiting,  vomiting blood, weight loss, and  weight gain.      Reports diverticulosis.     Denies anal fissure, black tarry stools, change in bowel habit, constipation, diarrhea, fecal incontinence, heme positive stool, hemorrhoids, irritable bowel syndrome, jaundice, light color stool, liver problems, rectal bleeding, and  rectal pain. Preventive Screening-Counseling & Management      Drug Use:  no.      Current Medications (verified): 1)  Imitrex 50 Mg  Tabs (Sumatriptan Succinate) .... As Needed 2)  Caltrate 600 1500 Mg Tabs (Calcium Carbonate) .... Once Daily 3)  Aspirin 81 Mg Tbec (Aspirin) .... Once Daily 4)  Fish Oil 1000 Mg Caps (Omega-3 Fatty Acids) .... Once Daily 5)  Benadryl 25 Mg Tabs (Diphenhydramine Hcl) .... As Needed  Allergies (verified): 1)  ! Macrodantin 2)  ! Keflex  Past History:  Past Medical History: Allergic rhinitis Osteoporosis migraine headache fractured left fifth metatarsal 09 Kidney Stones  Past Surgical History: Reviewed history from 02/28/2007 and no changes required. 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  The patient denies anemia, anxiety-new, arthritis/joint pain, back pain, blood in urine, breast changes/lumps, change in vision, confusion, cough, coughing up blood, depression-new, fainting, fatigue, fever, heart murmur, heart rhythm changes, itching, menstrual pain, muscle pains/cramps, night sweats, nosebleeds, pregnancy symptoms, shortness of breath, skin rash, sleeping  problems, sore throat, swelling of  feet/legs, swollen lymph glands, thirst - excessive , urination - excessive , urination changes/pain, urine leakage, vision changes, and voice change.    Vital Signs:  Patient profile:   70 year old female Menstrual status:  hysterectomy Height:      60 inches Weight:      197.13 pounds BMI:     38.64 Pulse rate:   60 / minute Pulse rhythm:   regular BP sitting:   110 / 72  (left arm) Cuff size:   regular  Vitals Entered By: June McMurray CMA Duncan Dull) (March 18, 2010 11:08 AM)  Physical Exam  General:  Well developed, well nourished, no acute distress. Head:  Normocephalic and atraumatic. Eyes:  PERRLA, no icterus. Ears:  bilateral hearing aid Nose:  No deformity, discharge,  or lesions. Mouth:  No deformity or lesions, dentition normal. Neck:  Supple; no masses or thyromegaly. Lungs:  Clear throughout to auscultation. Heart:  Regular rate and rhythm; no murmurs, rubs,  or bruits. Abdomen:  Soft, and nondistended. Some discomfort with palpation in the epigastric region. No masses, hepatosplenomegaly or hernias noted. Normal bowel sounds. Msk:  Symmetrical with no gross deformities. Normal posture. Pulses:  Normal pulses noted. Extremities:  No clubbing, cyanosis, edema or deformities noted. Neurologic:  Alert and  oriented x4;  grossly normal neurologically. Skin:  Intact without significant lesions or rashes. Cervical Nodes:  No significant cervical adenopathy,no supraclavicular adenopathy Psych:  Alert and cooperative. Normal mood and affect.   Impression & Recommendations:  Problem # 1:  ABDOMINAL PAIN, EPIGASTRIC (ICD-34.48) 70 year old who presents with two-month history of recurrent epigastric discomfort. Some complaints of GERD. Questionable dysphagia. Rule out biliary colic, peptic ulcer disease, GERD.  Plan: #1. Abdominal ultrasound #2. Upper endoscopy. #3. Initiate OTC PPI #4. Labs including CBC, comprehensive metabolic  panel, lipase, and urinalysis. Also lipid panel as she is having her routine PCP followup next week .  Problem # 2:  DYSPHAGIA (ICD-787.20) possible dysphagia. Evaluate with EGD.  Other Orders: TLB-TSH (Thyroid Stimulating Hormone) (84443-TSH) TLB-CBC Platelet - w/Differential (85025-CBCD) TLB-CMP (Comprehensive Metabolic Pnl) (80053-COMP) TLB-Lipase (83690-LIPASE) TLB-Udip w/ Micro (81001-URINE) TLB-Lipid Panel (80061-LIPID) EGD (EGD) Ultrasound Abdomen (UAS)  Patient Instructions: 1)  Please go to the basement floor before leaving the office today to have your CBC, CMET, TSH, Lipid Panel and Lipase drawn. 2)  You have been scheduled for an abdominal ultrasound on 03/19/10 @ 11 am at [facility] radiology. Please arrive at 10:45 am for registration. You should have nothing to eat or drink 6 hours prior to your appointment. 3)  You have been scheduled for an endoscopy at [facility] (4th floor of [facility]) on 03/22/10 @ 8 am. Please arrive at 7:30 am for registration. You should follow written instructions given to you at today's visit. 4)  Copy sent to : Dr Shela Commons. Todd 5)  The medication list was reviewed and reconciled.  All changed / newly prescribed medications were explained.  A complete medication list was provided to the patient / caregiver.

## 2010-07-29 NOTE — Assessment & Plan Note (Signed)
Summary: bloating/njr   Vital Signs:  Patient profile:   70 year old female Menstrual status:  hysterectomy Height:      60.25 inches Weight:      114 pounds BMI:     22.16 Temp:     98.3 degrees F oral BP sitting:   110 / 74  (left arm) Cuff size:   regular  Vitals Entered By: Kern Reap CMA Duncan Dull) (September 22, 2009 11:11 AM) CC: bloating Is Patient Diabetic? No Pain Assessment Patient in pain? no        CC:  bloating.  History of Present Illness: Barbara Pittman is a 70 year old single female, who comes in today for evaluation of abdominal bloating.  Two weeks ago she began having left lower quadrant abdominal pain.  She's had a history of diverticulitis.  She took her Cipro and Flagyl, and within two days.  The pain was gone.  She finished the medication however, now she is bloated and can't get her pants to fasten.  She has no fever, chills, nausea, vomiting, or diarrhea.  She states she I. takes Metamucil and MiraLax and has two to 3 loose bowel movements daily.  She is concerned about ovarian cancer.  Because of the bloating  Allergies: 1)  ! Macrodantin 2)  ! Keflex  Past History:  Past medical, surgical, family and social histories (including risk factors) reviewed for relevance to current acute and chronic problems.  Past Medical History: Reviewed history from 11/16/2007 and no changes required. Allergic rhinitis Osteoporosis migraine headache fractured left fifth metatarsal 09  Past Surgical History: Reviewed history from 02/28/2007 and no changes required. CB X2 Hysterectomy bunionectomy  Family History: Reviewed history from 03/18/2008 and no changes required. mother had leukemia brother with tonsillar cancer, told it was HPV another brother with bladder cancer  Social History: Reviewed history from 03/20/2007 and no changes required. Single Never Smoked Alcohol use-no Regular exercise-yes  Review of Systems      See HPI  Physical  Exam  General:  Well-developed,well-nourished,in no acute distress; alert,appropriate and cooperative throughout examination Abdomen:  Bowel sounds positive,abdomen soft and non-tender without masses, organomegaly or hernias noted. Rectal:  No external abnormalities noted. Normal sphincter tone. No rectal masses or tenderness.   Impression & Recommendations:  Problem # 1:  ABDOMINAL BLOATING (ICD-787.3) Assessment New  Orders: Venipuncture (14782) TLB-BMP (Basic Metabolic Panel-BMET) (80048-METABOL) TLB-CBC Platelet - w/Differential (85025-CBCD) TLB-Hepatic/Liver Function Pnl (80076-HEPATIC) Radiology Referral (Radiology)  Complete Medication List: 1)  Imitrex 50 Mg Tabs (Sumatriptan succinate) .... As needed 2)  Ca &vit D  3)  Zyrtec Allergy 10 Mg Tabs (Cetirizine hcl) .... At bedtime as needed 4)  Flonase 50 Mcg/act Susp (Fluticasone propionate) .... One shot r & l nose at bedtime 5)  Ciprofloxacin Hcl 500 Mg Tabs (Ciprofloxacin hcl) .... Take 1 tablet by mouth two times a day 6)  Metronidazole 500 Mg Tabs (Metronidazole) .... Take 1 tablet by mouth two times a day  Patient Instructions: 1)  continue current medications.  We will get u  set up for a CT scan of y  abdomen and pelvis  Appended Document: bloating/njr  Laboratory Results   Urine Tests    Routine Urinalysis   Color: yellow Appearance: Clear Glucose: negative   (Normal Range: Negative) Bilirubin: negative   (Normal Range: Negative) Ketone: negative   (Normal Range: Negative) Spec. Gravity: 1.010   (Normal Range: 1.003-1.035) Blood: negative   (Normal Range: Negative) pH: 6.5   (Normal Range: 5.0-8.0) Protein: negative   (  Normal Range: Negative) Urobilinogen: 0.2   (Normal Range: 0-1) Nitrite: negative   (Normal Range: Negative) Leukocyte Esterace: negative   (Normal Range: Negative)    Comments: Rita Ohara  September 22, 2009 1:37 PM

## 2010-07-29 NOTE — Letter (Signed)
Summary: EGD Instructions  [facility]  [address] [place], [place] [address]   Phone: [phone]  Fax: [phone]       Barbara Pittman    [birth year]    MRN: [id number]       Procedure Day /Date: Tuesday 03/22/10     Arrival Time: 7:30 am     Procedure Time: 8:00 am     Location of Procedure:                    _x  _ [facility] (4th Floor)  PREPARATION FOR ENDOSCOPY   On 03/22/10 THE DAY OF THE PROCEDURE:  1.   No solid foods, milk or milk products are allowed after midnight the night before your procedure.  2.   Do not drink anything colored red or purple.  Avoid juices with pulp.  No orange juice.  3.  You may drink clear liquids until 6:00 am, which is 2 hours before your procedure.                                                                                                CLEAR LIQUIDS INCLUDE: Water Jello Ice Popsicles Tea (sugar ok, no milk/cream) Powdered fruit flavored drinks Coffee (sugar ok, no milk/cream) Gatorade Juice: apple, white grape, white cranberry  Lemonade Clear bullion, consomm, broth Carbonated beverages (any kind) Strained chicken noodle soup Hard Candy   MEDICATION INSTRUCTIONS  Unless otherwise instructed, you should take regular prescription medications with a small sip of water as early as possible the morning of your procedure.                   OTHER INSTRUCTIONS  You will need a responsible adult at least 70 years of age to accompany you and drive you home.   This person must remain in the waiting room during your procedure.  Wear loose fitting clothing that is easily removed.  Leave jewelry and other valuables at home.  However, you may wish to bring a book to read or an iPod/MP3 player to listen to music as you wait for your procedure to start.  Remove all body piercing jewelry and leave at home.  Total time from sign-in until discharge is approximately 2-3 hours.  You should  go home directly after your procedure and rest.  You can resume normal activities the day after your procedure.  The day of your procedure you should not:   Drive   Make legal decisions   Operate machinery   Drink alcohol   Return to work  You will receive specific instructions about eating, activities and medications before you leave.    The above instructions have been reviewed and explained to me by   Lamona Curl CMA Duncan Dull)  March 18, 2010 12:03 PM     I fully understand and can verbalize these instructions _____________________________ Date 03/18/10

## 2010-07-29 NOTE — Progress Notes (Signed)
Summary: fyi  Phone Note Call from Patient   Summary of Call: patient is calling because she wanted dr Jazyiah Yiu to know that dr Marina Goodell did her colonoscopy.  however he does not have an opening until may.  the office will call back for an appointment. Initial call taken by: Kern Reap CMA Duncan Dull),  September 24, 2009 11:50 AM  Follow-up for Phone Call        please call Dr. Lamar Sprinkles nurse personally and see when they can work her in the next week Follow-up by: Roderick Pee MD,  September 24, 2009 1:39 PM  Additional Follow-up for Phone Call Additional follow up Details #1::        patient states that the bloating has gone down a lot.  Should she still be seen by the GI PA Monday Additional Follow-up by: Kern Reap CMA Duncan Dull),  September 25, 2009 11:27 AM

## 2010-07-29 NOTE — Procedures (Signed)
Summary: Colonoscopy/[facility]  Colonoscopy/[facility]   Imported By: Sherian Rein 09/25/2009 10:02:56  _____________________________________________________________________  External Attachment:    Type:   Image     Comment:   External Document

## 2010-07-29 NOTE — Assessment & Plan Note (Signed)
Summary: Followup post EGD for abdominal pain   History of Present Illness Visit Type: Initial Visit Primary GI MD: Yancey Flemings MD Primary Provider: Kelle Darting, MD Requesting Provider: Epimenio Foot, MD Chief Complaint: followup post EGD. Feeling better History of Present Illness:   70 year old female with a history of migraine headaches, kidney stones, and allergic rhinitis. She was evaluated March 18, 2010 for abdominal discomfort with fullness after meals. She complained of GERD as well as dysphasia. Laboratory studies were unrevealing. Abdominal ultrasound was negative. She was started on PPI therapy. Upper endoscopy revealed mild distal esophagitis as well as a large caliber distal esophageal ring. Also multiple erosions in the stomach. Testing for Helicobacter pylori was negative. The esophagus was dilated with a 54 [place] Maloney dilator. She was continued on PPI therapy and asked to followup at this time. She states she is feeling much better. Her abdominal discomfort and fullness has resolved. Good energy level. Tolerate omeprazole well.   GI Review of Systems      Denies abdominal pain, acid reflux, belching, bloating, chest pain, dysphagia with liquids, dysphagia with solids, heartburn, loss of appetite, nausea, vomiting, vomiting blood, weight loss, and  weight gain.        Denies anal fissure, black tarry stools, change in bowel habit, constipation, diarrhea, diverticulosis, fecal incontinence, heme positive stool, hemorrhoids, irritable bowel syndrome, jaundice, light color stool, liver problems, rectal bleeding, and  rectal pain.    Current Medications (verified): 1)  Imitrex 50 Mg  Tabs (Sumatriptan Succinate) .... As Needed 2)  Fish Oil 1000 Mg Caps (Omega-3 Fatty Acids) .... Once Daily 3)  Benadryl 25 Mg Tabs (Diphenhydramine Hcl) .... As Needed 4)  Omeprazole 40 Mg  Cpdr (Omeprazole) .Marland Kitchen.. 1 Each Day 30 Minutes Before Meal  Allergies (verified): 1)  ! Macrodantin 2)   ! Keflex  Past History:  Past Medical History: Reviewed history from 03/18/2010 and no changes required. Allergic rhinitis Osteoporosis migraine headache fractured left fifth metatarsal 09 Kidney Stones  Past Surgical History: Reviewed history from 02/28/2007 and no changes required. CB X2 Hysterectomy bunionectomy  Family History: Reviewed history from 03/18/2010 and no changes required. mother had leukemia brother with tonsillar cancer, told it was HPV another brother with bladder cancer Family History of Pancreatic Cancer:Aunt Family History of Diabetes: Brother Family History of Heart Disease: Paternal Uncles No FH of Colon Cancer:  Social History: Reviewed history from 03/18/2010 and no changes required. 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  Review of Systems  The patient denies allergy/sinus, anemia, anxiety-new, arthritis/joint pain, back pain, blood in urine, breast changes/lumps, change in vision, confusion, cough, coughing up blood, depression-new, fainting, fatigue, fever, headaches-new, hearing problems, heart murmur, heart rhythm changes, itching, menstrual pain, muscle pains/cramps, night sweats, nosebleeds, pregnancy symptoms, shortness of breath, skin rash, sleeping problems, sore throat, swelling of feet/legs, swollen lymph glands, thirst - excessive , urination - excessive , urination changes/pain, urine leakage, vision changes, and voice change.    Vital Signs:  Patient profile:   70 year old female Menstrual status:  hysterectomy Height:      60 inches Weight:      110 pounds BMI:     21.56 Pulse rate:   62 / minute Pulse rhythm:   regular BP sitting:   98 / 66  (right arm)  Vitals Entered By: Milford Cage NCMA (April 28, 2010 8:44 AM)  Physical Exam  General:  Well developed, well nourished, no acute  distress. Head:  Normocephalic and atraumatic. Eyes:  PERRLA, no  icterus. Mouth:  No deformity or lesions. Neck:  Supple; no masses or thyromegaly. Lungs:  Clear throughout to auscultation. Heart:  Regular rate and rhythm; no murmurs, rubs,  or bruits. Abdomen:  Soft, nontender and nondistended. No masses, hepatosplenomegaly or hernias noted. Normal bowel sounds. Pulses:  Normal pulses noted. Extremities:  no edema Neurologic:  alert and oriented Skin:  Intact without significant lesions or rashes. Psych:  Alert and cooperative. Normal mood and affect.   Impression & Recommendations:  Problem # 1:  GERD (ICD-530.81) GERD as the cause for previous GI symptoms.  Plan #1. Reflux precautions  #2. Continue omeprazole #3. Followup p.r.n.  Problem # 2:  ESOPHAGEAL STRICTURE (ICD-530.3) peptic stricture of the esophagus with resultant dysphagia. Status post esophageal dilation. Now asymptomatic post dilation on PPI.  Plan #1. Continue omeprazole #2. Followup p.r.n. recurrent dysphagia #3. Resume general medical care with Dr. Tawanna Cooler  Problem # 3:  GASTRITIS (ICD-535.50) H. pylori negative.  Plan #1. Continue PPI  Patient Instructions: 1)  Copy sent to : Kelle Darting, MD 2)  Please schedule a follow-up appointment as needed.  3)  The medication list was reviewed and reconciled.  All changed / newly prescribed medications were explained.  A complete medication list was provided to the patient / caregiver.

## 2010-07-29 NOTE — Letter (Signed)
Summary: Appt Reminder 2  [facility]  [address] [place], [place] [address]   Phone: [phone]  Fax: [phone]        March 22, 2010 MRN: [id number]    [facility] [address] [place], [place]  [address]    Dear Ms. CALLES,   You have a return appointment with Dr. Marina Goodell on 04/26/10 at 3:45 pm.  Please remember to bring a complete list of the medicines you are taking, your insurance card and your co-pay.  If you have to cancel or reschedule this appointment, please call before 5:00 pm the evening before to avoid a cancellation fee.  If you have any questions or concerns, please call [phone].    Sincerely,    Chales Abrahams CMA (AAMA)  Appended Document: Appt Reminder 2 letter mailed

## 2010-07-29 NOTE — Assessment & Plan Note (Signed)
Summary: depression/pt coming in around 12:45/cjr   Vital Signs:  Patient profile:   70 year old female Menstrual status:  hysterectomy Weight:      108 pounds Temp:     98.4 degrees F oral BP sitting:   110 / 76  (left arm) Cuff size:   regular  Vitals Entered By: Kern Reap CMA Duncan Dull) (July 15, 2010 12:42 PM) CC: possible depression   Primary Care Provider:  Kelle Darting, MD  CC:  possible depression.  History of Present Illness: Barbara Pittman is a 70 year old single female, who comes in today to discuss anxiety and depression related to the fact that her brother, who is 70 years of age.  He is in the process of dying from throat cancer.  She has offered to help but has been rejected by the wife and feels very bad  Allergies: 1)  ! Macrodantin 2)  ! * Kefzol  Social History: Reviewed history from 03/18/2010 and no changes required. 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 Alert and cooperative with normal attention span and concentration. No apparent delusions, illusions, hallucinations   Problems:  Medical Problems Added: 1)  Dx of Anxiety, Situational, Acute  (ICD-308.3)  Impression & Recommendations:  Problem # 1:  ANXIETY, SITUATIONAL, ACUTE (ICD-308.3) Assessment New  Complete Medication List: 1)  Imitrex 50 Mg Tabs (Sumatriptan succinate) .... As needed 2)  Fish Oil 1000 Mg Caps (Omega-3 fatty acids) .... Once daily 3)  Benadryl 25 Mg Tabs (Diphenhydramine hcl) .... As needed 4)  Omeprazole 40 Mg Cpdr (Omeprazole) .Marland Kitchen.. 1 each day 30 minutes before meal 5)  Klonopin 0.5 Mg Tabs (Clonazepam) .Marland Kitchen.. 1 tab @ bedtime as needed  Patient Instructions: 1)  take Klonopin .5 nightly,  p.r.n. 2)  Return p.r.n. Prescriptions: KLONOPIN 0.5 MG TABS (CLONAZEPAM) 1 tab @ bedtime as needed  #30 x 3   Entered and Authorized by:   Roderick Pee MD   Signed by:   Roderick Pee MD on 07/15/2010   Method used:   Print then Give to Patient   RxID:   [id number]    Orders Added: 1)  Est. Patient Level III [56213]

## 2010-07-29 NOTE — Miscellaneous (Signed)
Summary: Omeprazole RX  Clinical Lists Changes  Medications: Added new medication of OMEPRAZOLE 40 MG  CPDR (OMEPRAZOLE) 1 each day 30 minutes before meal - Signed Rx of OMEPRAZOLE 40 MG  CPDR (OMEPRAZOLE) 1 each day 30 minutes before meal;  #30 x 11;  Signed;  Entered by: Durwin Glaze RN;  Authorized by: Hilarie Fredrickson MD;  Method used: Electronically to [name] [address] [place], [place], [place]  [address], Ph: [phone], Fax: [phone]    Prescriptions: OMEPRAZOLE 40 MG  CPDR (OMEPRAZOLE) 1 each day 30 minutes before meal  #30 x 11   Entered by:   Durwin Glaze RN   Authorized by:   Hilarie Fredrickson MD   Signed by:   Durwin Glaze RN on 03/22/2010   Method used:   Electronically to        [name] [address] (retail)       [address]       [place], [place]  [address]       Ph: [phone]       Fax: [phone]   RxID:   [id number]

## 2010-09-03 ENCOUNTER — Other Ambulatory Visit: Payer: Self-pay | Admitting: Family Medicine

## 2010-09-03 DIAGNOSIS — Z1231 Encounter for screening mammogram for malignant neoplasm of breast: Secondary | ICD-10-CM

## 2010-09-13 ENCOUNTER — Telehealth: Payer: Self-pay | Admitting: *Deleted

## 2010-09-13 NOTE — Telephone Encounter (Signed)
Elevated BP

## 2010-09-14 ENCOUNTER — Encounter: Payer: Self-pay | Admitting: Family Medicine

## 2010-09-14 ENCOUNTER — Ambulatory Visit (INDEPENDENT_AMBULATORY_CARE_PROVIDER_SITE_OTHER): Payer: Medicare Other | Admitting: Family Medicine

## 2010-09-14 VITALS — BP 120/68 | Temp 98.5°F | Ht 60.25 in | Wt 110.0 lb

## 2010-09-14 DIAGNOSIS — R0602 Shortness of breath: Secondary | ICD-10-CM

## 2010-09-14 NOTE — Progress Notes (Signed)
  Subjective:    Patient ID: Barbara Pittman, female    DOB: 04-Dec-1940, 70 y.o.   MRN: [id number]  HPI Barbara Pittman  Is a delightful, 70 year old, single female, who comes in today for evaluation of shortness of breath on exertion.  She states about 3 weeks ago she began having shortness of breath climbing stairs.  If she walks on level ground.  She feels normal.  However, she climbs stairs.  She gets short of breath.  She denies any chest pain.  She's never had a cardiac or pulmonary, symptoms.  She's never been a smoker.  No history of diabetes, hypertension, metabolic abnormalities, et Karie Soda.   Her father died from a motor vehicle accident.  Her mother died of Leukemia  and a stroke two brothers with cancer.  No coronary disease in the family  Review of Systems    Cardiopulmonary metabolic review of systems all negative Objective:   Physical Exam Well-developed well-nourished, female in no acute distress.  Examination of the lungs are normal.  Heart PMI is in the fifth intercostal space, midclavicular line.  No heaves, or thrills.  S1, S2, within normal limits.  S2 is physiologically split.  I can appreciate no murmur, rubs, or gallops.  No carotid bruits.  Aorta normal.  No bruit       Assessment & Plan:  Dyspnea on exertion.........Marland Kitchen Recommend cardiac stress test, rule out underlying coronary disease

## 2010-09-14 NOTE — Patient Instructions (Signed)
We will get she is set up to have a stress test in  cardiology to be sure your heart is normal

## 2010-09-22 ENCOUNTER — Encounter (HOSPITAL_COMMUNITY): Payer: PRIVATE HEALTH INSURANCE | Admitting: Radiology

## 2010-10-01 ENCOUNTER — Encounter: Payer: Self-pay | Admitting: Physician Assistant

## 2010-10-04 ENCOUNTER — Ambulatory Visit (INDEPENDENT_AMBULATORY_CARE_PROVIDER_SITE_OTHER): Payer: Medicare Other | Admitting: Physician Assistant

## 2010-10-04 DIAGNOSIS — R0602 Shortness of breath: Secondary | ICD-10-CM

## 2010-10-04 NOTE — Progress Notes (Signed)
Exercise Treadmill Test  Pre-Exercise Testing Evaluation Rhythm: normal sinus  Rate: 60   PR:  .10 QRS:  .09  QT:  .40 QTc: .40     Test  Exercise Tolerance Test Ordering MD: Governor Specking M.D  Interpreting MD:  Tereso Newcomer PA-C  Unique Test No: 1  Treadmill:  1  Indication for ETT: exertional dyspnea  Contraindication to ETT: No   Stress Modality: exercise - treadmill  Cardiac Imaging Performed: non   Protocol: standard Bruce - maximal  Max BP: 175/97  Max MPHR (bpm):  151 85% MPR (bpm):  128  MPHR obtained (bpm): 130 % MPHR obtained: 88%  Reached 85% MPHR (min:sec):  5:11 Total Exercise Time (min-sec): 6:00  Workload in METS:  7.0 Borg Scale:17  Reason ETT Terminated:  dyspnea    ST Segment Analysis At Rest: normal ST segments - no evidence of significant ST depression With Exercise: borderline ST changes  Other Information Arrhythmia:  No Angina during ETT:  absent (0) Quality of ETT:  non-diagnostic  ETT Interpretation:  borderline (indeterminate) with non-specific ST changes  Comments: Fair exercise tolerance. Test stopped due to dyspnea. No chest pain. Normal BP response to exercise. Borderline inferolateral changes at maximal exercise.  Recommendations: Schedule Exercise Myoview. Discussed with patient.

## 2010-10-07 ENCOUNTER — Ambulatory Visit
Admission: RE | Admit: 2010-10-07 | Discharge: 2010-10-07 | Disposition: A | Discharge status: Home / Self Care | Payer: Medicare Other | Source: Ambulatory Visit | Attending: Family Medicine | Admitting: Family Medicine

## 2010-10-07 DIAGNOSIS — Z1231 Encounter for screening mammogram for malignant neoplasm of breast: Secondary | ICD-10-CM

## 2010-10-12 ENCOUNTER — Ambulatory Visit (HOSPITAL_COMMUNITY): Payer: Medicare Other | Attending: Cardiology | Admitting: Radiology

## 2010-10-12 VITALS — Ht 61.0 in | Wt 104.0 lb

## 2010-10-12 DIAGNOSIS — R0789 Other chest pain: Secondary | ICD-10-CM

## 2010-10-12 DIAGNOSIS — R0989 Other specified symptoms and signs involving the circulatory and respiratory systems: Secondary | ICD-10-CM

## 2010-10-12 DIAGNOSIS — R0609 Other forms of dyspnea: Secondary | ICD-10-CM

## 2010-10-12 DIAGNOSIS — R0602 Shortness of breath: Secondary | ICD-10-CM | POA: Insufficient documentation

## 2010-10-12 MED ORDER — TECHNETIUM TC 99M TETROFOSMIN IV KIT
33.0000 | PACK | Freq: Once | INTRAVENOUS | Status: AC | PRN
  Administered 2010-10-12: 33 via INTRAVENOUS

## 2010-10-12 MED ORDER — TECHNETIUM TC 99M TETROFOSMIN IV KIT
11.0000 | PACK | Freq: Once | INTRAVENOUS | Status: AC | PRN
  Administered 2010-10-12: 11 via INTRAVENOUS

## 2010-10-12 NOTE — Progress Notes (Addendum)
[facility] SITE 3 NUCLEAR MED [address] [place] [address] [phone]  Cardiology Nuclear Med Study  Barbara Pittman is a 70 y.o. female [id number] 1941-03-26   Nuclear Med Background Indication for Stress Test:  Evaluation for Ischemia and indeterminate GXT: borderline NS ST changes History:  Asthma and GXT: Indeterminate  Borderline NS ST T changes Cardiac Risk Factors: Lipids  Symptoms:  DOE, Palpitations and SOB   Nuclear Pre-Procedure Caffeine/Decaff Intake:  None NPO After: 530pm   Lungs:  clear IV 0.9% NS with Angio Cath:  22g  IV Site: R Wrist  IV Started by:  Cathlyn Parsons, RN  Chest Size (in):  34 Cup Size: B  Height: 5\' 1"  (1.549 m)  Weight:  104 lb (47.174 kg)  BMI:  Body mass index is 19.65 kg/(m^2). Tech Comments:      Nuclear Med Study 1 or 2 day study: 1 day  Stress Test Type:  Stress  Reading MD: Olga Millers, MD  Order Authorizing Provider: D.McLean  Resting Radionuclide: Technetium 28m Tetrofosmin  Resting Radionuclide Dose: 11 mCi   Stress Radionuclide:  Technetium 83m Tetrofosmin  Stress Radionuclide Dose: 33 mCi           Stress Protocol Rest HR: 51 Stress HR: 144  Rest BP: 116/72 Stress BP: 168/71  Exercise Time (min): 7:15 METS: 8.90   Predicted Max HR: 151 bpm % Max HR: 95.36 bpm Rate Pressure Product: 65784   Dose of Adenosine (mg):  n/a Dose of Lexiscan: n/a mg  Dose of Atropine (mg): n/a Dose of Dobutamine: n/a mcg/kg/min (at max HR)  Stress Test Technologist: Milana Na, EMT-P  Nuclear Technologist:  Domenic Polite, CNMT     Rest Procedure:  Myocardial perfusion imaging was performed at rest 45 minutes following the intravenous administration of Technetium 77m Tetrofosmin. Rest ECG: Sinus Bradycardia  Stress Procedure:  The patient exercised for 7:15.  The patient stopped due to fatigue and denied any chest pain.  There were no significant ST-T wave changes and rare pvcs.  Technetium 93m  Tetrofosmin was injected at peak exercise and myocardial perfusion imaging was performed after a brief delay. Stress ECG: Insignificant upsloping ST segment depression.  QPS Raw Data Images:  There is interference from nuclear activity from structures below the diaphragm.  This does not affect the ability to read the study. Stress Images:  There is decreased uptake in the anteroseptal wall. Rest Images:  There is decreased uptake in the anteroseptal wall, less prominent compared to the stress images. Subtraction (SDS):  These findings are consistent with probable soft tissue attenuation; cannot R/O mild ischemia. Transient Ischemic Dilatation (Normal <1.22): 1.10 Lung/Heart Ratio (Normal <0.45):  .34   Quantitative Gated Spect Images QGS EDV:  53 ml QGS ESV:  17 ml QGS cine images:  Normal Wall Motion QGS EF: 68%  Impression Exercise Capacity:  Fair exercise capacity. BP Response:  Normal blood pressure response. Clinical Symptoms:  No chest pain. ECG Impression:  Insignificant upsloping ST segment depression. Comparison with Prior Nuclear Study: No images to compare  Overall Impression:  Probably normal stress nuclear study with soft tissue attenuation; cannot R/O mild anteroseptal ischemia.  Olga Millers   No ECG changes.  Probably normal study with soft tissue attenuation.  [place]

## 2010-10-13 NOTE — Progress Notes (Signed)
ROUTED TO DR. MCLEAN.Falecha Clark ° ° °

## 2010-10-18 ENCOUNTER — Telehealth (HOSPITAL_COMMUNITY): Payer: Self-pay | Admitting: Radiology

## 2010-10-18 NOTE — Progress Notes (Signed)
Pt given results by telephone 

## 2011-03-28 ENCOUNTER — Ambulatory Visit (INDEPENDENT_AMBULATORY_CARE_PROVIDER_SITE_OTHER): Payer: Medicare Other | Admitting: Family Medicine

## 2011-03-28 ENCOUNTER — Encounter: Payer: Self-pay | Admitting: Family Medicine

## 2011-03-28 VITALS — BP 110/70 | Temp 98.5°F | Ht 60.5 in | Wt 104.0 lb

## 2011-03-28 DIAGNOSIS — G43909 Migraine, unspecified, not intractable, without status migrainosus: Secondary | ICD-10-CM

## 2011-03-28 DIAGNOSIS — Z23 Encounter for immunization: Secondary | ICD-10-CM

## 2011-03-28 DIAGNOSIS — K219 Gastro-esophageal reflux disease without esophagitis: Secondary | ICD-10-CM

## 2011-03-28 LAB — POCT URINALYSIS DIPSTICK
Spec Grav, UA: 1.02
Urobilinogen, UA: 0.2
pH, UA: 5.5

## 2011-03-28 LAB — BASIC METABOLIC PANEL
BUN: 11 mg/dL (ref 6–23)
Chloride: 105 mEq/L (ref 96–112)
Potassium: 4.1 mEq/L (ref 3.5–5.1)
Sodium: 142 mEq/L (ref 135–145)

## 2011-03-28 LAB — HEPATIC FUNCTION PANEL
ALT: 18 U/L (ref 0–35)
AST: 24 U/L (ref 0–37)
Total Protein: 7.5 g/dL (ref 6.0–8.3)

## 2011-03-28 LAB — CBC WITH DIFFERENTIAL/PLATELET
Basophils Relative: 0.5 % (ref 0.0–3.0)
Eosinophils Relative: 1.1 % (ref 0.0–5.0)
HCT: 37.7 % (ref 36.0–46.0)
Hemoglobin: 12.5 g/dL (ref 12.0–15.0)
Lymphs Abs: 0.8 10*3/uL (ref 0.7–4.0)
MCV: 84.8 fl (ref 78.0–100.0)
Monocytes Absolute: 0.3 10*3/uL (ref 0.1–1.0)
RBC: 4.45 Mil/uL (ref 3.87–5.11)
WBC: 4.2 10*3/uL — ABNORMAL LOW (ref 4.5–10.5)

## 2011-03-28 LAB — TSH: TSH: 0.92 u[IU]/mL (ref 0.35–5.50)

## 2011-03-28 MED ORDER — SUMATRIPTAN SUCCINATE 50 MG PO TABS: 50.0000 mg | ORAL_TABLET | ORAL | Status: DC | PRN

## 2011-03-28 MED ORDER — OMEPRAZOLE 40 MG PO CPDR: 40.0000 mg | DELAYED_RELEASE_CAPSULE | Freq: Every day | ORAL | Status: DC

## 2011-03-28 NOTE — Progress Notes (Signed)
  Subjective:    Patient ID: Barbara Pittman, female    DOB: [birth year], 70 y.o.   MRN: [id number]  HPI [name] Is a 70 year old, single female, nonsmoker, who comes in today for a Medicare wellness examination because of a history of reflux esophagitis secondary esophageal stricture.  Migraine headaches.  She takes Prilosec 40 mg daily the difficulty swallowing.  She feels well.  She takes Imitrex once or twice monthly for migraine headaches.  She gets routine eye care, recently purchased hearing aids, regular dental care, BSE monthly, annual mammography, colonoscopy, normal, Pneumovax 2007 booster today, tetanus, 2003, shingles 2009, seasonal flu shot today.  Activities of daily living.  Normal.  She walks on a regular basis.  Cognitive function, normal.  She lives independently and does all her own finances.  No guns in the house.  Home health safety reviewed.  New issues identified.  She does have a healthcare power of attorney and living will.   Review of Systems  Constitutional: Negative.   HENT: Negative.   Eyes: Negative.   Respiratory: Negative.   Cardiovascular: Negative.   Gastrointestinal: Negative.   Genitourinary: Negative.   Musculoskeletal: Negative.   Neurological: Negative.   Hematological: Negative.   Psychiatric/Behavioral: Negative.        Objective:   Physical Exam  Constitutional: She appears well-developed and well-nourished.  HENT:  Head: Normocephalic and atraumatic.  Right Ear: External ear normal.  Left Ear: External ear normal.  Nose: Nose normal.  Mouth/Throat: Oropharynx is clear and moist.  Eyes: EOM are normal. Pupils are equal, round, and reactive to light.  Neck: Normal range of motion. Neck supple. No thyromegaly present.  Cardiovascular: Normal rate, regular rhythm, normal heart sounds and intact distal pulses.  Exam reveals no gallop and no friction rub.   No murmur heard. Pulmonary/Chest: Effort normal and breath sounds normal.    Abdominal: Soft. Bowel sounds are normal. She exhibits no distension and no mass. There is no tenderness. There is no rebound.  Genitourinary: Vagina normal. Guaiac negative stool. No vaginal discharge found.  Musculoskeletal: Normal range of motion.  Lymphadenopathy:    She has no cervical adenopathy.  Neurological: She is alert. She has normal reflexes. No cranial nerve deficit. She exhibits normal muscle tone. Coordination normal.  Skin: Skin is warm and dry.  Psychiatric: She has a normal mood and affect. Her behavior is normal. Judgment and thought content normal.          Assessment & Plan:  Healthy female.  Reflux esophagitis with history of esophageal stricture.  Continue Prilosec 40 daily avoid aspirin and aspirin products.  Migraine headaches continue to take Imitrex p.r.n.  Return in one year, sooner if any problems

## 2011-03-28 NOTE — Patient Instructions (Signed)
Continue your current medications.  Remember to walk daily.  Return in one year or sooner if any problems

## 2011-03-30 ENCOUNTER — Telehealth: Payer: Self-pay | Admitting: *Deleted

## 2011-03-30 NOTE — Telephone Encounter (Signed)
patient  Would like to know if it is time for her bone density test?

## 2011-03-31 NOTE — Telephone Encounter (Signed)
Tried to call but line was busy

## 2011-03-31 NOTE — Telephone Encounter (Signed)
This has been changed to every 10 years

## 2011-04-01 NOTE — Telephone Encounter (Signed)
Left message on machine for patient

## 2011-06-30 ENCOUNTER — Encounter: Payer: Self-pay | Admitting: Family Medicine

## 2011-06-30 ENCOUNTER — Telehealth: Payer: Self-pay | Admitting: *Deleted

## 2011-06-30 ENCOUNTER — Ambulatory Visit (INDEPENDENT_AMBULATORY_CARE_PROVIDER_SITE_OTHER): Payer: Medicare Other | Admitting: Family Medicine

## 2011-06-30 DIAGNOSIS — L409 Psoriasis, unspecified: Secondary | ICD-10-CM | POA: Insufficient documentation

## 2011-06-30 DIAGNOSIS — L408 Other psoriasis: Secondary | ICD-10-CM | POA: Diagnosis not present

## 2011-06-30 DIAGNOSIS — R42 Dizziness and giddiness: Secondary | ICD-10-CM | POA: Diagnosis not present

## 2011-06-30 DIAGNOSIS — E039 Hypothyroidism, unspecified: Secondary | ICD-10-CM | POA: Diagnosis not present

## 2011-06-30 MED ORDER — CALCIPOTRIENE 0.005 % EX CREA: TOPICAL_CREAM | CUTANEOUS | Status: DC

## 2011-06-30 NOTE — Progress Notes (Signed)
  Subjective:    Patient ID: Barbara Pittman, female    DOB: 05-Jun-1941, 71 y.o.   MRN: [id number]  HPI Barbara Pittman is a 71 year old female, who comes in today for evaluation of two problems.  She states over the last, week she's had intermittent episodes where she feels lightheaded.  It's not true vertigo is not spinning.  Its and sensation of rocking that last for a few seconds and goes away.  She wondered if her blood pressure might be elevated.  BP today 108/76, pulse 70 and regular.  She's also been losing hair in her beautician wants to know if she might have a thyroid problem.  She's also had a lesion on the left side of her scalp for about 3 months.   Review of Systems General neurologic, cardiovascular, dermatologic review of systems otherwise negative    Objective:   Physical Exam  Well-developed well-nourished, female, in no acute distress.  Examination of the scalp shows a lesion consistent with psoriasis.  Cardiovascular exam normal      Assessment & Plan:  Episodic lightheadedness, unknown etiology.  Psoriasis confined to the scalp.  Plan Dovonex.  Hair loss.  Check thyroid levels

## 2011-06-30 NOTE — Patient Instructions (Signed)
Applied the Dovonex, nightly at bedtime.  I will call you next week when we get the report on your thyroid level

## 2011-06-30 NOTE — Telephone Encounter (Signed)
Please call the pharmacy to find out if there is anything cheaper........Marland Kitchen Maybe the Dovonex shampoo  would be a cheap alternative

## 2011-06-30 NOTE — Telephone Encounter (Signed)
[name] is calling because the prescription for Dovonex will cause $345.51.  Is there anything else she can try?

## 2011-07-01 NOTE — Telephone Encounter (Signed)
Left message on machine for patient  To call back with name of medication for new prescription

## 2011-07-06 NOTE — Telephone Encounter (Signed)
Spoke with patient and she is going to the pharmacy today and will call back

## 2011-07-07 NOTE — Telephone Encounter (Signed)
Patient went to the pharmacy and is going to try T-Gel shampoo for now

## 2011-08-30 ENCOUNTER — Other Ambulatory Visit: Payer: Self-pay | Admitting: Family Medicine

## 2011-08-30 DIAGNOSIS — Z1231 Encounter for screening mammogram for malignant neoplasm of breast: Secondary | ICD-10-CM

## 2011-10-10 ENCOUNTER — Ambulatory Visit
Admission: RE | Admit: 2011-10-10 | Discharge: 2011-10-10 | Disposition: A | Discharge status: Home / Self Care | Payer: Medicare Other | Source: Ambulatory Visit | Attending: Family Medicine | Admitting: Family Medicine

## 2011-10-10 DIAGNOSIS — Z1231 Encounter for screening mammogram for malignant neoplasm of breast: Secondary | ICD-10-CM | POA: Diagnosis not present

## 2011-11-09 DIAGNOSIS — H903 Sensorineural hearing loss, bilateral: Secondary | ICD-10-CM | POA: Diagnosis not present

## 2011-11-09 DIAGNOSIS — H905 Unspecified sensorineural hearing loss: Secondary | ICD-10-CM | POA: Diagnosis not present

## 2012-02-14 ENCOUNTER — Telehealth: Payer: Self-pay | Admitting: Family Medicine

## 2012-02-14 DIAGNOSIS — L409 Psoriasis, unspecified: Secondary | ICD-10-CM

## 2012-02-14 NOTE — Telephone Encounter (Signed)
Pt called and is req referral to dermatologist re: psoriasis in scalp, as previously discussed with Dr Tawanna Cooler.

## 2012-02-14 NOTE — Telephone Encounter (Signed)
Referral request sent 

## 2012-03-20 ENCOUNTER — Ambulatory Visit (INDEPENDENT_AMBULATORY_CARE_PROVIDER_SITE_OTHER): Payer: Medicare Other

## 2012-03-20 DIAGNOSIS — Z23 Encounter for immunization: Secondary | ICD-10-CM

## 2012-04-06 DIAGNOSIS — L219 Seborrheic dermatitis, unspecified: Secondary | ICD-10-CM | POA: Diagnosis not present

## 2012-04-27 DIAGNOSIS — H2510 Age-related nuclear cataract, unspecified eye: Secondary | ICD-10-CM | POA: Diagnosis not present

## 2012-05-23 ENCOUNTER — Encounter: Payer: Self-pay | Admitting: Family Medicine

## 2012-05-23 ENCOUNTER — Ambulatory Visit (INDEPENDENT_AMBULATORY_CARE_PROVIDER_SITE_OTHER): Payer: Medicare Other | Admitting: Family Medicine

## 2012-05-23 VITALS — BP 110/80 | Temp 98.2°F | Ht 60.5 in | Wt 104.0 lb

## 2012-05-23 DIAGNOSIS — K219 Gastro-esophageal reflux disease without esophagitis: Secondary | ICD-10-CM

## 2012-05-23 DIAGNOSIS — R5381 Other malaise: Secondary | ICD-10-CM

## 2012-05-23 DIAGNOSIS — K222 Esophageal obstruction: Secondary | ICD-10-CM

## 2012-05-23 DIAGNOSIS — Z23 Encounter for immunization: Secondary | ICD-10-CM | POA: Diagnosis not present

## 2012-05-23 DIAGNOSIS — J309 Allergic rhinitis, unspecified: Secondary | ICD-10-CM

## 2012-05-23 DIAGNOSIS — G43909 Migraine, unspecified, not intractable, without status migrainosus: Secondary | ICD-10-CM | POA: Diagnosis not present

## 2012-05-23 DIAGNOSIS — M810 Age-related osteoporosis without current pathological fracture: Secondary | ICD-10-CM | POA: Diagnosis not present

## 2012-05-23 DIAGNOSIS — R5383 Other fatigue: Secondary | ICD-10-CM

## 2012-05-23 DIAGNOSIS — Z0000 Encounter for general adult medical examination without abnormal findings: Secondary | ICD-10-CM

## 2012-05-23 LAB — CBC WITH DIFFERENTIAL/PLATELET
Basophils Absolute: 0 10*3/uL (ref 0.0–0.1)
Basophils Relative: 0.3 % (ref 0.0–3.0)
Eosinophils Absolute: 0.1 10*3/uL (ref 0.0–0.7)
Hemoglobin: 13.5 g/dL (ref 12.0–15.0)
Lymphocytes Relative: 23 % (ref 12.0–46.0)
MCHC: 32.7 g/dL (ref 30.0–36.0)
MCV: 84.8 fl (ref 78.0–100.0)
Monocytes Absolute: 0.4 10*3/uL (ref 0.1–1.0)
Neutro Abs: 4 10*3/uL (ref 1.4–7.7)
RDW: 14.2 % (ref 11.5–14.6)

## 2012-05-23 LAB — POCT URINALYSIS DIPSTICK
Glucose, UA: NEGATIVE
Nitrite, UA: NEGATIVE
Spec Grav, UA: 1.025
Urobilinogen, UA: 0.2
pH, UA: 6.5

## 2012-05-23 LAB — BASIC METABOLIC PANEL
BUN: 17 mg/dL (ref 6–23)
CO2: 28 mEq/L (ref 19–32)
Calcium: 9.7 mg/dL (ref 8.4–10.5)
Creatinine, Ser: 0.7 mg/dL (ref 0.4–1.2)
GFR: 93.67 mL/min (ref >60.00)
Glucose, Bld: 94 mg/dL (ref 70–99)
Sodium: 140 mEq/L (ref 135–145)

## 2012-05-23 MED ORDER — SUMATRIPTAN SUCCINATE 50 MG PO TABS: 50.0000 mg | ORAL_TABLET | ORAL | Status: DC | PRN

## 2012-05-23 MED ORDER — OMEPRAZOLE 40 MG PO CPDR: 40.0000 mg | DELAYED_RELEASE_CAPSULE | Freq: Every day | ORAL | Status: DC

## 2012-05-23 NOTE — Progress Notes (Signed)
  Subjective:    Patient ID: Barbara Pittman, female    DOB: October 02, 1940, 71 y.o.   MRN: [id number]  HPI Barbara Pittman is a 71 year old single female nonsmoker who comes in today for a Medicare wellness examination  She takes 40 mg of Prilosec Monday Wednesday Friday. Her last upper endoscopy was about 2 years ago. At that juncture she had a stricture which was dilated. I recommend she take her Prilosec  She takes Imitrex when necessary for migraine headaches but they've decreased over time.  She gets routine eye care by Dr. Nile Riggs due to have both cataracts removed this year, regular dental care, BSE monthly, and you mammography, colonoscopy 6 years ago normal, tetanus 2003 booster today, shingles 2009, Pneumovax x2, seasonal flu shot 2013.  Cognitive function normal she works at her church on a daily basis home health safety reviewed no issues identified, no guns in the house, she does have a health care power of attorney and living will   Review of Systems  Constitutional: Negative.   HENT: Negative.   Eyes: Negative.   Respiratory: Negative.   Cardiovascular: Negative.   Gastrointestinal: Negative.   Genitourinary: Negative.   Musculoskeletal: Negative.   Neurological: Negative.   Hematological: Negative.   Psychiatric/Behavioral: Negative.        Objective:   Physical Exam  Constitutional: She appears well-developed and well-nourished.  HENT:  Head: Normocephalic and atraumatic.  Right Ear: External ear normal.  Left Ear: External ear normal.  Nose: Nose normal.  Mouth/Throat: Oropharynx is clear and moist.  Eyes: EOM are normal. Pupils are equal, round, and reactive to light.  Neck: Normal range of motion. Neck supple. No thyromegaly present.  Cardiovascular: Normal rate, regular rhythm, normal heart sounds and intact distal pulses.  Exam reveals no gallop and no friction rub.   No murmur heard. Pulmonary/Chest: Effort normal and breath sounds normal.  Abdominal: Soft. Bowel  sounds are normal. She exhibits no distension and no mass. There is no tenderness. There is no rebound.  Genitourinary:       Bilateral breast exam normal  Musculoskeletal: Normal range of motion.  Lymphadenopathy:    She has no cervical adenopathy.  Neurological: She is alert. She has normal reflexes. No cranial nerve deficit. She exhibits normal muscle tone. Coordination normal.  Skin: Skin is warm and dry.  Psychiatric: She has a normal mood and affect. Her behavior is normal. Judgment and thought content normal.          Assessment & Plan:  Healthy female  History of reflux esophagitis with esophageal stricture asymptomatic on Prilosec 40 mg 3 times weekly continue that dose  Migraine headaches continue Imitrex when necessary  History of osteoporosis recheck bone density continue exercise program

## 2012-05-23 NOTE — Patient Instructions (Signed)
Continue the Prilosec Monday Wednesday Friday  Imitrex when necessary  We will call you a report of your lab work in your bone density

## 2012-05-31 DIAGNOSIS — H2510 Age-related nuclear cataract, unspecified eye: Secondary | ICD-10-CM | POA: Diagnosis not present

## 2012-06-04 ENCOUNTER — Ambulatory Visit (INDEPENDENT_AMBULATORY_CARE_PROVIDER_SITE_OTHER)
Admission: RE | Admit: 2012-06-04 | Discharge: 2012-06-04 | Disposition: A | Discharge status: Home / Self Care | Payer: Medicare Other | Source: Ambulatory Visit

## 2012-06-04 DIAGNOSIS — M810 Age-related osteoporosis without current pathological fracture: Secondary | ICD-10-CM | POA: Diagnosis not present

## 2012-07-04 DIAGNOSIS — H2510 Age-related nuclear cataract, unspecified eye: Secondary | ICD-10-CM | POA: Diagnosis not present

## 2012-07-11 DIAGNOSIS — H2510 Age-related nuclear cataract, unspecified eye: Secondary | ICD-10-CM | POA: Diagnosis not present

## 2012-07-18 ENCOUNTER — Telehealth: Payer: Self-pay | Admitting: Family Medicine

## 2012-07-18 NOTE — Telephone Encounter (Signed)
Pt would like results of bone density test and CPX labs also. Thank you.

## 2012-07-19 ENCOUNTER — Other Ambulatory Visit: Payer: Self-pay | Admitting: *Deleted

## 2012-07-19 NOTE — Telephone Encounter (Signed)
Left message on machine returning patient's call 

## 2012-07-19 NOTE — Telephone Encounter (Signed)
Patient is aware that labs are normal.  Bone density results please

## 2012-08-03 NOTE — Telephone Encounter (Signed)
Patient is aware 

## 2012-08-03 NOTE — Telephone Encounter (Signed)
Yes, she should resume calcium and vit D supplements.  She is very close to being osteoporotic in her hips.  I suggest OV with Dr. Tawanna Cooler to consider other tx options.

## 2012-08-03 NOTE — Telephone Encounter (Signed)
Patient calling back for bone density results.  She has stopped her calcium and vit d and would like to know if she should start taking these again.

## 2012-09-11 ENCOUNTER — Other Ambulatory Visit: Payer: Self-pay

## 2012-09-11 DIAGNOSIS — Z1231 Encounter for screening mammogram for malignant neoplasm of breast: Secondary | ICD-10-CM

## 2012-10-10 ENCOUNTER — Ambulatory Visit
Admission: RE | Admit: 2012-10-10 | Discharge: 2012-10-10 | Disposition: A | Discharge status: Home / Self Care | Payer: Medicare Other | Source: Ambulatory Visit

## 2012-10-10 DIAGNOSIS — Z1231 Encounter for screening mammogram for malignant neoplasm of breast: Secondary | ICD-10-CM

## 2012-10-15 ENCOUNTER — Telehealth: Payer: Self-pay | Admitting: Family Medicine

## 2012-10-15 NOTE — Telephone Encounter (Signed)
Pt states she was notified to inform Dr. Tawanna Cooler of [facility]  According to the patient her mammogram showed dense tissue.  Pt wants to know what Dr. Tawanna Cooler recommends she do.

## 2012-10-15 NOTE — Telephone Encounter (Signed)
Follow the advice of the radiologist

## 2012-10-17 NOTE — Telephone Encounter (Signed)
Patient is aware 

## 2012-11-26 ENCOUNTER — Ambulatory Visit (INDEPENDENT_AMBULATORY_CARE_PROVIDER_SITE_OTHER): Payer: Medicare Other | Admitting: Family Medicine

## 2012-11-26 ENCOUNTER — Encounter: Payer: Self-pay | Admitting: Family Medicine

## 2012-11-26 VITALS — BP 104/72 | Temp 98.7°F | Wt 104.0 lb

## 2012-11-26 DIAGNOSIS — M79609 Pain in unspecified limb: Secondary | ICD-10-CM

## 2012-11-26 DIAGNOSIS — M79642 Pain in left hand: Secondary | ICD-10-CM | POA: Insufficient documentation

## 2012-11-26 DIAGNOSIS — Z961 Presence of intraocular lens: Secondary | ICD-10-CM | POA: Diagnosis not present

## 2012-11-26 DIAGNOSIS — R922 Inconclusive mammogram: Secondary | ICD-10-CM | POA: Insufficient documentation

## 2012-11-26 DIAGNOSIS — M810 Age-related osteoporosis without current pathological fracture: Secondary | ICD-10-CM

## 2012-11-26 NOTE — Patient Instructions (Addendum)
The hand surgeon is Dr. Molly Maduro Cypher,,,,,,,,,,,, orthopedist at the [facility]  Walk daily as you're doing  I would recommend a digital mammogram with your next study  BSE monthly  Return for your annual physical exam sooner if any problems

## 2012-11-26 NOTE — Progress Notes (Signed)
  Subjective:    Patient ID: Barbara Pittman, female    DOB: 30-Dec-1940, 72 y.o.   MRN: [id number]  Barbara Pittman is an 72 year old female who comes in today for evaluation of 3 problems  For the last couple weeks she's had pain in the ventral portion of her left hand she would like that evaluated. She also feels a lump there. No history of trauma  She [facility] a [facility] from the [facility] after her mammogram in April. She was told she had dense breasts she would like to discuss that what that means  She has a history of osteoporosis and is on calcium vitamin D and exercise program   Review of Systems Review of systems otherwise negative    Objective:   Physical Exam  Well-developed and nourished female no acute distress bilateral breast exam was normal BSE was again taught. No palpable masses. Examination the hand shows a cyst in the left tendon      Assessment & Plan:  Cyst left tendon sheath plan refer to hand surgery when necessary  History of dense breasts BSE monthly yearly digital mammography  Osteoporosis,,,,,,,,,,, again emphasized exercise

## 2013-03-14 ENCOUNTER — Ambulatory Visit (INDEPENDENT_AMBULATORY_CARE_PROVIDER_SITE_OTHER): Payer: Medicare Other

## 2013-03-14 DIAGNOSIS — Z23 Encounter for immunization: Secondary | ICD-10-CM

## 2013-03-19 ENCOUNTER — Telehealth: Payer: Self-pay | Admitting: Family Medicine

## 2013-03-19 NOTE — Telephone Encounter (Signed)
Patient Information:  Caller Name: Teira  Phone: [phone]  Patient: Barbara Pittman  Gender: Female  DOB: 1941/01/15  Age: 72 Years  PCP: Evelena Peat [facility])  Office Follow Up:  Does the office need to follow up with this patient?: No  Instructions For The Office: N/A  RN Note:  Afebrile.Immunization reaction looked like 2 little bumps and was itchy and then went away. She is not having any difficulty at all presently but her daughters thought it would be a good idea to let the Dr. office know that she may have had a reaction last week after getting the vaccine. RN/CAN advised calling back if any symptoms reoccur.  Symptoms  Reason For Call & Symptoms: Flu sheet on  Thursday, 03/14/2013 and thinks she may have had a reaction to the immunization. Tired, nasal drainage intermittently in the am and felt dizzy or lightheaded.  Reviewed Health History In EMR: Yes  Reviewed Medications In EMR: Yes  Reviewed Allergies In EMR: Yes  Reviewed Surgeries / Procedures: Yes  Date of Onset of Symptoms: 03/14/2013  Guideline(s) Used:  Immunization Reactions  Disposition Per Guideline:   Home Care  Reason For Disposition Reached:   Mild immunization reaction  Advice Given:  Cold Pack for Local Reaction at Injection Site:  Apply a cold pack or ice in a wet washcloth to the area for 20 minutes. Repeat in 1 hour.  Then apply as needed for the first 48 hours after the injection (Reason: reduce the pain and swelling).  Pain and Fever Medicines:  For pain or fever relief, take either acetaminophen or ibuprofen.  Call Back If:  Fever lasts more than 3 days  Pain lasts more than 3 days  Injection site starts to look infected  You become worse.  H1N1 Influenza Vaccine (inactivated; injected)  Local pain, redness, swelling at injection site  Fever  Muscle aches, headache, nausea  Influenza (TIV; injection) Vaccine:  Local pain at injection site  Fever  Aches  If these  symptoms occur, they usually last 1-2 days.  Influenza (LAIV; intranasal) Vaccine  Runny nose or nasal congestion  Fever, chills, muscle aches, and feeling tired  Headache  Sore throat  Patient Will Follow Care Advice:  YES

## 2013-03-21 DIAGNOSIS — H905 Unspecified sensorineural hearing loss: Secondary | ICD-10-CM | POA: Diagnosis not present

## 2013-03-21 DIAGNOSIS — H903 Sensorineural hearing loss, bilateral: Secondary | ICD-10-CM | POA: Diagnosis not present

## 2013-06-06 ENCOUNTER — Encounter: Payer: Self-pay | Admitting: Family Medicine

## 2013-06-06 ENCOUNTER — Ambulatory Visit (INDEPENDENT_AMBULATORY_CARE_PROVIDER_SITE_OTHER): Payer: Medicare Other | Admitting: Family Medicine

## 2013-06-06 VITALS — BP 110/78 | Temp 98.4°F | Ht 60.5 in | Wt 105.0 lb

## 2013-06-06 DIAGNOSIS — M810 Age-related osteoporosis without current pathological fracture: Secondary | ICD-10-CM

## 2013-06-06 DIAGNOSIS — K219 Gastro-esophageal reflux disease without esophagitis: Secondary | ICD-10-CM

## 2013-06-06 DIAGNOSIS — K222 Esophageal obstruction: Secondary | ICD-10-CM

## 2013-06-06 DIAGNOSIS — Z0000 Encounter for general adult medical examination without abnormal findings: Secondary | ICD-10-CM

## 2013-06-06 DIAGNOSIS — G43909 Migraine, unspecified, not intractable, without status migrainosus: Secondary | ICD-10-CM

## 2013-06-06 DIAGNOSIS — J309 Allergic rhinitis, unspecified: Secondary | ICD-10-CM | POA: Diagnosis not present

## 2013-06-06 DIAGNOSIS — Z23 Encounter for immunization: Secondary | ICD-10-CM

## 2013-06-06 LAB — POCT URINALYSIS DIPSTICK
Bilirubin, UA: NEGATIVE
Glucose, UA: NEGATIVE
Nitrite, UA: NEGATIVE
Urobilinogen, UA: 0.2
pH, UA: 6

## 2013-06-06 LAB — CBC WITH DIFFERENTIAL/PLATELET
Basophils Absolute: 0 10*3/uL (ref 0.0–0.1)
Eosinophils Absolute: 0.1 10*3/uL (ref 0.0–0.7)
Hemoglobin: 12.9 g/dL (ref 12.0–15.0)
Lymphocytes Relative: 20.6 % (ref 12.0–46.0)
MCHC: 33.3 g/dL (ref 30.0–36.0)
Monocytes Relative: 5.4 % (ref 3.0–12.0)
Neutro Abs: 4.6 10*3/uL (ref 1.4–7.7)
Neutrophils Relative %: 72.7 % (ref 43.0–77.0)
Platelets: 252 10*3/uL (ref 150.0–400.0)
RDW: 15.1 % — ABNORMAL HIGH (ref 11.5–14.6)

## 2013-06-06 LAB — BASIC METABOLIC PANEL
BUN: 20 mg/dL (ref 6–23)
Calcium: 9.3 mg/dL (ref 8.4–10.5)
GFR: 80.59 mL/min (ref >60.00)
Glucose, Bld: 86 mg/dL (ref 70–99)
Sodium: 141 mEq/L (ref 135–145)

## 2013-06-06 MED ORDER — SUMATRIPTAN SUCCINATE 50 MG PO TABS: 50.0000 mg | ORAL_TABLET | ORAL | Status: DC | PRN

## 2013-06-06 NOTE — Progress Notes (Signed)
   Subjective:    Patient ID: Barbara Pittman, female    DOB: Dec 25, 1940, 72 y.o.   MRN: [id number]  HPI [name] is a 72 year old single female nonsmoker who comes in today for a Medicare wellness examination  She has a history of osteopenia and takes calcium vitamin D and she exercises on a regular basis   She takes Metrazol when necessary for reflux currently asymptomatic  She takes Imitrex when necessary for migraine headaches she has a migraine maybe once a month now.  She gets routine eye care, dental care, BSE monthly, and you mammography, colonoscopy normal, cognitive function normal she walks on a regular basis home health safety reviewed no issues identified, no guns in the house, she does have a health care power of attorney and living will   Review of Systems  Constitutional: Negative.   HENT: Negative.   Eyes: Negative.   Respiratory: Negative.   Cardiovascular: Negative.   Gastrointestinal: Negative.   Endocrine: Negative.   Genitourinary: Negative.   Musculoskeletal: Negative.   Allergic/Immunologic: Negative.   Neurological: Negative.   Hematological: Negative.   Psychiatric/Behavioral: Negative.        Objective:   Physical Exam  Nursing note and vitals reviewed. Constitutional: She appears well-developed and well-nourished.  HENT:  Head: Normocephalic and atraumatic.  Right Ear: External ear normal.  Left Ear: External ear normal.  Nose: Nose normal.  Mouth/Throat: Oropharynx is clear and moist.  Eyes: EOM are normal. Pupils are equal, round, and reactive to light.  Neck: Normal range of motion. Neck supple. No thyromegaly present.  Cardiovascular: Normal rate, regular rhythm, normal heart sounds and intact distal pulses.  Exam reveals no gallop and no friction rub.   No murmur heard. Pulmonary/Chest: Effort normal and breath sounds normal.  Abdominal: Soft. Bowel sounds are normal. She exhibits no distension and no mass. There is no tenderness. There is  no rebound.  Genitourinary:  She's had her uterus removed for nonmalignant reasons therefore pelvic examination not indicated  Bilateral breast exam normal  Musculoskeletal: Normal range of motion.  Lymphadenopathy:    She has no cervical adenopathy.  Neurological: She is alert. She has normal reflexes. No cranial nerve deficit. She exhibits normal muscle tone. Coordination normal.  Skin: Skin is warm and dry.  Psychiatric: She has a normal mood and affect. Her behavior is normal. Judgment and thought content normal.          Assessment & Plan:  Healthy female  History of decreased bone density continue exercise calcium vitamin D  Migraine headaches Imitrex when necessary  History of esophageal stricture currently asymptomatic

## 2013-06-06 NOTE — Patient Instructions (Signed)
Continue your calcium vitamin D and daily exercise  Imitrex when necessary for migraines  Return in one year sooner if any problems

## 2013-09-02 ENCOUNTER — Other Ambulatory Visit: Payer: Self-pay

## 2013-09-02 DIAGNOSIS — Z1231 Encounter for screening mammogram for malignant neoplasm of breast: Secondary | ICD-10-CM

## 2013-10-10 DIAGNOSIS — H1130 Conjunctival hemorrhage, unspecified eye: Secondary | ICD-10-CM | POA: Diagnosis not present

## 2013-10-11 ENCOUNTER — Ambulatory Visit
Admission: RE | Admit: 2013-10-11 | Discharge: 2013-10-11 | Disposition: A | Discharge status: Home / Self Care | Payer: Medicare Other | Source: Ambulatory Visit

## 2013-10-11 DIAGNOSIS — Z1231 Encounter for screening mammogram for malignant neoplasm of breast: Secondary | ICD-10-CM

## 2013-10-15 DIAGNOSIS — H905 Unspecified sensorineural hearing loss: Secondary | ICD-10-CM | POA: Diagnosis not present

## 2013-10-15 DIAGNOSIS — H61899 Other specified disorders of external ear, unspecified ear: Secondary | ICD-10-CM | POA: Diagnosis not present

## 2013-12-02 DIAGNOSIS — Z961 Presence of intraocular lens: Secondary | ICD-10-CM | POA: Diagnosis not present

## 2014-03-13 ENCOUNTER — Ambulatory Visit (INDEPENDENT_AMBULATORY_CARE_PROVIDER_SITE_OTHER): Payer: Medicare Other | Admitting: Family Medicine

## 2014-03-13 ENCOUNTER — Encounter: Payer: Self-pay | Admitting: Family Medicine

## 2014-03-13 VITALS — BP 104/68 | Temp 98.2°F | Wt 109.0 lb

## 2014-03-13 DIAGNOSIS — Z23 Encounter for immunization: Secondary | ICD-10-CM

## 2014-03-13 DIAGNOSIS — L723 Sebaceous cyst: Secondary | ICD-10-CM | POA: Diagnosis not present

## 2014-03-13 NOTE — Patient Instructions (Signed)
Return when necessary 

## 2014-03-13 NOTE — Progress Notes (Signed)
Pre visit review using our clinic review tool, if applicable. No additional management support is needed unless otherwise documented below in the visit note. 

## 2014-03-13 NOTE — Progress Notes (Signed)
   Subjective:    Patient ID: Barbara Pittman, female    DOB: 1940/11/05, 73 y.o.   MRN: [id number]  HPI Barbara Pittman is a 73 year old female who comes in today for evaluation of 3 cystic lesion on her face the been increasing in size     Review of Systems Review of systems otherwise negative    Objective:   Physical Exam  Well-developed well-nourished female no acute distress vital signs stable he is afebrile examination of face shows 3 cystic lesions on the right side of the nose #2 left side of the nose #3 on her chin.  And all 3 lesions were cleaned with alcohol a sterile needle was used to excise the head of the cyst and the cyst was drained manually.      Assessment & Plan:  Inflamed sebaceous cysts on the face x3.............. treated as above

## 2014-04-18 ENCOUNTER — Encounter: Payer: Self-pay | Admitting: Internal Medicine

## 2014-04-18 ENCOUNTER — Ambulatory Visit (INDEPENDENT_AMBULATORY_CARE_PROVIDER_SITE_OTHER): Payer: Medicare Other | Admitting: Internal Medicine

## 2014-04-18 VITALS — BP 110/70 | Temp 98.4°F | Wt 109.0 lb

## 2014-04-18 DIAGNOSIS — R110 Nausea: Secondary | ICD-10-CM | POA: Diagnosis not present

## 2014-04-18 DIAGNOSIS — M542 Cervicalgia: Secondary | ICD-10-CM | POA: Diagnosis not present

## 2014-04-18 NOTE — Patient Instructions (Signed)
  Can try  Nasal cortisone such as flonase or nasacort  For allergy sx with claritin or other.  This discomfort acts like neck problem .  With muscle spasm but can also have a pinched nerve  consider chest and neck xrays . If not getting better  Can try aleve twice a day but  If you do   You need to take it with prilosec  To protect  the stomach. Otherwise tylenol ok and neck hygiene attention to posture .  FU with  Dr Sherren Mocha if not getting better in  2-3 weeks

## 2014-04-18 NOTE — Progress Notes (Signed)
Pre visit review using our clinic review tool, if applicable. No additional management support is needed unless otherwise documented below in the visit note.   Chief Complaint  Patient presents with  . Headache    neckshoulder  left discomfort dizzy nausea    HPI: Patient Barbara Pittman  comes in today for SDA for  new problem evaluation. PCP NA Onset about a week ago of discomfort in the left neck posterior head area going down to the shoulder blade without associated vomiting but has some nausea and dizziness wonders if it was a migraine and took her migraine medicine without help. Complains of neck and left shoulder area discomfort radiating but not down to her hands and not associated with weakness Volunteers moving lifting befores shoulder sx. no specific injury but did a lot of lifting Head feels full. Has history of allergies. ROS: See pertinent positives and negatives per HPI. Hearing aids no falling numbness or weakness. Does tend to sit in bed with poor neck posture watching TV and some keyboarding stretching might help.  Past Medical History  Diagnosis Date  . Allergy   . Osteoporosis   . Migraine   . Kidney stone     Family History  Problem Relation Age of Onset  . Leukemia Mother   . Cancer Brother     bladder   . Diabetes Brother   . Cancer Brother     TONSILLAR CANCER, WAS TOLD IT WAS HPV  . Pancreatic cancer      AUNT  . Heart disease      PATERNAL UNCLE    History   Social History  . Marital Status: Single    Spouse Name: N/A    Number of Children: N/A  . Years of Education: N/A   Occupational History  . RETIRED    Social History Main Topics  . Smoking status: Never Smoker   . Smokeless tobacco: None  . Alcohol Use: No  . Drug Use: No  . Sexual Activity: None   Other Topics Concern  . None   Social History Narrative   SINGLE   NEVER SMOKED   NO ETOH   REGULAR EXERCISE- YES   OCCUPATION: RETIRED   ILLICIT DRUG USE: NO      Outpatient Encounter Prescriptions as of 04/18/2014  Medication Sig  . calcium-vitamin D 250-100 MG-UNIT per tablet Take 1 tablet by mouth daily.  . SUMAtriptan (IMITREX) 50 MG tablet Take 1 tablet (50 mg total) by mouth as needed.    EXAM:  BP 110/70  Temp(Src) 98.4 F (36.9 C) (Oral)  Wt 109 lb (49.442 kg)  Body mass index is 20.93 kg/(m^2).  GENERAL: vitals reviewed and listed above, alert, oriented, appears well hydrated and in no acute distress wears glasses and hearing aids looks well HEENT: atraumatic, conjunctiva  clear, no obvious abnormalities on inspection of external nose and ears TMs intact OP : no lesion edema or exudate tongue is midline NECK: no obvious masses on inspection palpation no midline tenderness some location of sensitivity and spasm left trapezius upper thorax no supraclavicular masses but has a sensitivity area at the base of her left neck and this brachioplexus area LUNGS: clear to auscultation bilaterally, no wheezes, rales or rhonchi, good air movement CV: HRRR, no clubbing cyanosis or  nl cap refill  MS: moves all extremities without noticeable focal  abnormality PSYCH: pleasant and cooperative, no obvious depression or anxiety NEURO: oriented x 3 CN 3-12 appear intact. No focal muscle  weakness or atrophy. DTRs symmetrical. Gait WNL.  Grossly non focal. No tremor or abnormal movement. Skin no acute rashes over area of discomfort ASSESSMENT AND PLAN:  Discussed the following assessment and plan:  Neck pain on left side - Radiating to upper scapular area most likely musculoskeletal or neuritis. Not severe don't think it's a migraine but could be triggered by such consider a neck   Nausea without vomiting - Seems to come and go might be related to her headaches no definition on exam. Exercises given stretching expectant management caution with anti-inflammatories if she takes them followup with PCP if persistent progressive for further  evaluation. -Patient advised to return or notify health care team  if symptoms worsen ,persist or new concerns arise.  Patient Instructions   Can try  Nasal cortisone such as flonase or nasacort  For allergy sx with claritin or other.  This discomfort acts like neck problem .  With muscle spasm but can also have a pinched nerve  consider chest and neck xrays . If not getting better  Can try aleve twice a day but  If you do   You need to take it with prilosec  To protect  the stomach. Otherwise tylenol ok and neck hygiene attention to posture .  FU with  Dr Sherren Mocha if not getting better in  2-3 weeks     Standley Brooking. Panosh M.D.  Total visit 22mins > 50% spent counseling and coordinating care    Patient declined chest x-ray neck x-ray at this time which seems reasonable to give this more time in followup. Counseled about neck hygiene in position and exercises.

## 2014-04-21 DIAGNOSIS — H905 Unspecified sensorineural hearing loss: Secondary | ICD-10-CM | POA: Diagnosis not present

## 2014-04-21 DIAGNOSIS — Q181 Preauricular sinus and cyst: Secondary | ICD-10-CM | POA: Diagnosis not present

## 2014-06-09 ENCOUNTER — Encounter: Payer: Self-pay | Admitting: Family Medicine

## 2014-06-09 ENCOUNTER — Ambulatory Visit (INDEPENDENT_AMBULATORY_CARE_PROVIDER_SITE_OTHER): Payer: Medicare Other | Admitting: Family Medicine

## 2014-06-09 VITALS — BP 102/70 | Temp 98.3°F | Ht 60.25 in | Wt 108.0 lb

## 2014-06-09 DIAGNOSIS — G43009 Migraine without aura, not intractable, without status migrainosus: Secondary | ICD-10-CM | POA: Diagnosis not present

## 2014-06-09 LAB — CBC WITH DIFFERENTIAL/PLATELET
BASOS PCT: 0.6 % (ref 0.0–3.0)
Basophils Absolute: 0 10*3/uL (ref 0.0–0.1)
EOS ABS: 0.1 10*3/uL (ref 0.0–0.7)
Eosinophils Relative: 1.4 % (ref 0.0–5.0)
HEMATOCRIT: 39.9 % (ref 36.0–46.0)
HEMOGLOBIN: 12.8 g/dL (ref 12.0–15.0)
LYMPHS ABS: 1.1 10*3/uL (ref 0.7–4.0)
Lymphocytes Relative: 19.7 % (ref 12.0–46.0)
MCHC: 32.2 g/dL (ref 30.0–36.0)
MCV: 85 fl (ref 78.0–100.0)
MONO ABS: 0.4 10*3/uL (ref 0.1–1.0)
Monocytes Relative: 6.7 % (ref 3.0–12.0)
NEUTROS ABS: 4.2 10*3/uL (ref 1.4–7.7)
NEUTROS PCT: 71.6 % (ref 43.0–77.0)
Platelets: 231 10*3/uL (ref 150.0–400.0)
RBC: 4.7 Mil/uL (ref 3.87–5.11)
RDW: 14.8 % (ref 11.5–15.5)
WBC: 5.8 10*3/uL (ref 4.0–10.5)

## 2014-06-09 LAB — BASIC METABOLIC PANEL
BUN: 19 mg/dL (ref 6–23)
CALCIUM: 9.2 mg/dL (ref 8.4–10.5)
CHLORIDE: 106 meq/L (ref 96–112)
CO2: 27 meq/L (ref 19–32)
CREATININE: 0.6 mg/dL (ref 0.4–1.2)
GFR: 103.97 mL/min (ref >60.00)
GLUCOSE: 93 mg/dL (ref 70–99)
Potassium: 4.3 mEq/L (ref 3.5–5.1)
Sodium: 138 mEq/L (ref 135–145)

## 2014-06-09 LAB — POCT URINALYSIS DIPSTICK
Bilirubin, UA: NEGATIVE
Glucose, UA: NEGATIVE
KETONES UA: NEGATIVE
Nitrite, UA: NEGATIVE
PH UA: 5.5
PROTEIN UA: NEGATIVE
SPEC GRAV UA: 1.025
Urobilinogen, UA: 0.2

## 2014-06-09 LAB — TSH: TSH: 1.24 u[IU]/mL (ref 0.35–4.50)

## 2014-06-09 MED ORDER — SUMATRIPTAN SUCCINATE 50 MG PO TABS: 50.0000 mg | ORAL_TABLET | ORAL | Status: DC | PRN

## 2014-06-09 NOTE — Progress Notes (Signed)
   Subjective:    Patient ID: Barbara Pittman, female    DOB: [birth year], 73 y.o.   MRN: [id number]  HPI Barbara Pittman is a delightful 73 year old single female nonsmoker who comes in today for evaluation of migraine headaches  She has migraine headaches and they've decreased over time. She now only has a couple year. She takes Imitrex when necessary  For the past 2 months she's had pain in the left side of her neck that radiates to her shoulder. She describes the pain is intermittent, dull, a 1-3, no neurologic symptoms. She occurs more often when she reads all day. She tends to read for hours and hours with a neck bent.  One daughter in [employer] and one daughter in [place]  She volunteers at her church daily  She gets routine eye care...Marland KitchenMarland KitchenMarland Kitchen cataracts removed a couple years ago.... Regular dental care, BSE monthly, annual mammography, last colonoscopy normal.  BMD 2 years ago showed some moderate bone loss. Prescribed daily exercise  Vaccinations updated by Apolonio Schneiders  Cognitive function normal she walks daily home health safety reviewed no issues identified, no guns in the house, she does have a healthcare power of attorney and living well   Review of Systems  Constitutional: Negative.   HENT: Negative.   Eyes: Negative.   Respiratory: Negative.   Cardiovascular: Negative.   Gastrointestinal: Negative.   Endocrine: Negative.   Genitourinary: Negative.   Musculoskeletal: Negative.   Skin: Negative.   Allergic/Immunologic: Negative.   Neurological: Negative.   Hematological: Negative.   Psychiatric/Behavioral: Negative.        Objective:   Physical Exam  Constitutional: She appears well-developed and well-nourished.  HENT:  Head: Normocephalic and atraumatic.  Right Ear: External ear normal.  Left Ear: External ear normal.  Nose: Nose normal.  Mouth/Throat: Oropharynx is clear and moist.  Eyes: EOM are normal. Pupils are equal, round, and reactive to light.  Neck: Normal  range of motion. Neck supple. No JVD present. No tracheal deviation present. No thyromegaly present.  Cardiovascular: Normal rate, regular rhythm, normal heart sounds and intact distal pulses.  Exam reveals no gallop and no friction rub.   No murmur heard. No carotid nor aortic bruits  Pulmonary/Chest: Effort normal and breath sounds normal. No stridor. No respiratory distress. She has no wheezes. She has no rales. She exhibits no tenderness.  Abdominal: Soft. Bowel sounds are normal. She exhibits no distension and no mass. There is no tenderness. There is no rebound and no guarding.  Genitourinary:  Bilateral breast exam normal  She had her uterus removed for prolapse years ago. GYN wise asymptomatic therefore pelvic exam not indicated  Musculoskeletal: Normal range of motion.  Lymphadenopathy:    She has no cervical adenopathy.  Neurological: She is alert. She has normal reflexes. No cranial nerve deficit. She exhibits normal muscle tone. Coordination normal.  Skin: Skin is warm and dry. No rash noted. No erythema. No pallor.  Total body skin exam normal  Psychiatric: She has a normal mood and affect. Her behavior is normal. Judgment and thought content normal.  Nursing note and vitals reviewed.         Assessment & Plan:  Healthy female  Migraine headaches..... Imitrex when necessary  New problem of cervical neck pain...Marland KitchenMarland KitchenMarland Kitchen either muscular or a degenerative disc disease and because of her posture..... Reading hours every day.......Marland Kitchen recommend daily stretching exercises Motrin 400 twice a day changing her posture

## 2014-06-09 NOTE — Patient Instructions (Signed)
Change the angle at which he read  Motrin 400 mg twice a day when necessary with food  Imitrex when necessary  Return in one year sooner if any problems  Remember to walk 30 minutes daily for overall and bone health

## 2014-06-09 NOTE — Progress Notes (Signed)
Pre visit review using our clinic review tool, if applicable. No additional management support is needed unless otherwise documented below in the visit note. 

## 2014-09-08 ENCOUNTER — Other Ambulatory Visit: Payer: Self-pay

## 2014-09-08 DIAGNOSIS — Z1231 Encounter for screening mammogram for malignant neoplasm of breast: Secondary | ICD-10-CM

## 2014-10-13 ENCOUNTER — Ambulatory Visit
Admission: RE | Admit: 2014-10-13 | Discharge: 2014-10-13 | Disposition: A | Discharge status: Home / Self Care | Payer: Medicare Other | Source: Ambulatory Visit

## 2014-10-13 DIAGNOSIS — Z1231 Encounter for screening mammogram for malignant neoplasm of breast: Secondary | ICD-10-CM

## 2015-01-12 DIAGNOSIS — Z961 Presence of intraocular lens: Secondary | ICD-10-CM | POA: Diagnosis not present

## 2015-01-15 ENCOUNTER — Encounter: Payer: Self-pay | Admitting: Gastroenterology

## 2015-01-15 ENCOUNTER — Encounter: Payer: Self-pay | Admitting: Internal Medicine

## 2015-01-27 ENCOUNTER — Encounter: Payer: Self-pay | Admitting: Internal Medicine

## 2015-02-18 ENCOUNTER — Encounter: Payer: Self-pay | Admitting: Adult Health

## 2015-02-18 ENCOUNTER — Ambulatory Visit (INDEPENDENT_AMBULATORY_CARE_PROVIDER_SITE_OTHER): Payer: Medicare Other | Admitting: Adult Health

## 2015-02-18 VITALS — Temp 98.1°F | Ht 60.3 in | Wt 107.4 lb

## 2015-02-18 DIAGNOSIS — N949 Unspecified condition associated with female genital organs and menstrual cycle: Secondary | ICD-10-CM

## 2015-02-18 DIAGNOSIS — R5382 Chronic fatigue, unspecified: Secondary | ICD-10-CM

## 2015-02-18 DIAGNOSIS — R1030 Lower abdominal pain, unspecified: Secondary | ICD-10-CM

## 2015-02-18 LAB — POCT URINALYSIS DIPSTICK
BILIRUBIN UA: NEGATIVE
Blood, UA: NEGATIVE
GLUCOSE UA: NEGATIVE
Ketones, UA: NEGATIVE
Leukocytes, UA: NEGATIVE
Nitrite, UA: NEGATIVE
Protein, UA: NEGATIVE
SPEC GRAV UA: 1.015
Urobilinogen, UA: 0.2
pH, UA: 5.5

## 2015-02-18 NOTE — Progress Notes (Signed)
Subjective:    Patient ID: Barbara Pittman, female    DOB: 1941-05-27, 74 y.o.   MRN: [id number]  HPI  74 year old female who presents to the office today with two vague complaints.   1) Fatigue -She endorses being fatigued for 2 weeks. She is not sleeping her normal amount and endorses waking up in the middle of the night on occasion. Endorses a generalized feeling of "being run down". She denies any fevers, chills,nausea, vomiting, weight gain or weight loss. She denies feeling depressed. She has a good appetite. And she is getting out of the house as she normally would.  2) Possible yeast infection  - She reports that she woke up Monday morning with a "tingling" in her vagina. This "tingling" comes and goes and she does not have it every day. She is not currently exhibiting this symptom in the office. She denies any vaginal discharge or odor, nor does she have any redness or itching around the vagina. She denies using any estrogen creams and has not been on antibiotics recently. She denies any UTI type symptoms  Review of Systems  Constitutional: Positive for fatigue.  HENT: Negative.   Respiratory: Negative.   Cardiovascular: Negative.   Gastrointestinal: Negative.   Genitourinary: Positive for vaginal pain ("tingling"). Negative for dysuria, urgency, frequency, hematuria, flank pain, decreased urine volume, vaginal bleeding, vaginal discharge, genital sores and pelvic pain.  Psychiatric/Behavioral: Positive for sleep disturbance.  All other systems reviewed and are negative.      Objective:   Physical Exam  Constitutional: She is oriented to person, place, and time. She appears well-developed and well-nourished.  HENT:  Head: Normocephalic and atraumatic.  Right Ear: External ear normal.  Left Ear: External ear normal.  Nose: Nose normal.  Mouth/Throat: Oropharynx is clear and moist. No oropharyngeal exudate.  Cardiovascular: Normal rate, regular rhythm, normal heart sounds  and intact distal pulses.  Exam reveals no gallop and no friction rub.   No murmur heard. Pulmonary/Chest: Effort normal and breath sounds normal. No respiratory distress. She has no wheezes. She has no rales. She exhibits no tenderness.  Abdominal: Soft. Bowel sounds are normal. She exhibits no distension and no mass. There is no tenderness. There is no rebound and no guarding.  Genitourinary: Vagina normal. No vaginal discharge found.  No abnormalities during vaginal exam.No Redness or inflammation on outside of vagina  Musculoskeletal: Normal range of motion. She exhibits no edema or tenderness.  Lymphadenopathy:    She has no cervical adenopathy.  Neurological: She is alert and oriented to person, place, and time.  Skin: Skin is warm and dry. No rash noted. No erythema. No pallor.  Psychiatric: She has a normal mood and affect. Her behavior is normal. Judgment and thought content normal.  Nursing note and vitals reviewed.      Assessment & Plan:  1. Vaginal discomfort - unknown etiology at this point of why she is having "tingling" in her vagina. There was no abnormality during exam. May be related to dryness? - POCT urinalysis dipstick; Standing - Follow up if no improvement in the next week or if symptoms become worse.  - Consider follow up with GYN or estrogen cream.    2. Chronic fatigue - Likely due to sleep issue, cannot rule out anemia, thyroid or viral illness at this point.  - BMP, CBC w Diff and TSH ordered during downtime.  - Will follow up with patient  - Melatonin for sleep disturbance.

## 2015-02-18 NOTE — Progress Notes (Signed)
Pre visit review using our clinic review tool, if applicable. No additional management support is needed unless otherwise documented below in the visit note. 

## 2015-02-19 LAB — CBC WITH DIFFERENTIAL/PLATELET
Basophils Absolute: 0 10*3/uL (ref 0.0–0.1)
Basophils Relative: 0.3 % (ref 0.0–3.0)
EOS ABS: 0.1 10*3/uL (ref 0.0–0.7)
Eosinophils Relative: 2.1 % (ref 0.0–5.0)
HCT: 38 % (ref 36.0–46.0)
Hemoglobin: 12.6 g/dL (ref 12.0–15.0)
LYMPHS ABS: 1.6 10*3/uL (ref 0.7–4.0)
Lymphocytes Relative: 25.4 % (ref 12.0–46.0)
MCHC: 33.2 g/dL (ref 30.0–36.0)
MCV: 84.3 fl (ref 78.0–100.0)
Monocytes Absolute: 0.4 10*3/uL (ref 0.1–1.0)
Monocytes Relative: 6.2 % (ref 3.0–12.0)
NEUTROS ABS: 4.1 10*3/uL (ref 1.4–7.7)
NEUTROS PCT: 66 % (ref 43.0–77.0)
PLATELETS: 244 10*3/uL (ref 150.0–400.0)
RBC: 4.51 Mil/uL (ref 3.87–5.11)
RDW: 14.8 % (ref 11.5–15.5)
WBC: 6.3 10*3/uL (ref 4.0–10.5)

## 2015-02-19 LAB — BASIC METABOLIC PANEL
BUN: 12 mg/dL (ref 6–23)
CALCIUM: 9.9 mg/dL (ref 8.4–10.5)
CO2: 30 meq/L (ref 19–32)
Chloride: 103 mEq/L (ref 96–112)
Creatinine, Ser: 0.63 mg/dL (ref 0.40–1.20)
GFR: 98.09 mL/min (ref >60.00)
GLUCOSE: 91 mg/dL (ref 70–99)
Potassium: 4.8 mEq/L (ref 3.5–5.1)
SODIUM: 140 meq/L (ref 135–145)

## 2015-02-19 LAB — TSH: TSH: 1.89 u[IU]/mL (ref 0.35–4.50)

## 2015-02-19 NOTE — Addendum Note (Signed)
Addended by: Elmer Picker on: 02/19/2015 08:26 AM   Modules accepted: Orders

## 2015-02-20 ENCOUNTER — Telehealth: Payer: Self-pay | Admitting: Family Medicine

## 2015-02-20 NOTE — Telephone Encounter (Signed)
Spoke to pt, told her labs were normal per [facility]. Pt verbalized understanding.

## 2015-02-20 NOTE — Telephone Encounter (Signed)
Pt returning your call Pt would like results of labs. Please call back.

## 2015-02-23 ENCOUNTER — Encounter: Payer: Self-pay | Admitting: Family Medicine

## 2015-02-23 ENCOUNTER — Ambulatory Visit (INDEPENDENT_AMBULATORY_CARE_PROVIDER_SITE_OTHER): Payer: Medicare Other | Admitting: Family Medicine

## 2015-02-23 VITALS — BP 120/76 | Temp 98.8°F | Ht 60.36 in | Wt 107.0 lb

## 2015-02-23 DIAGNOSIS — R300 Dysuria: Secondary | ICD-10-CM

## 2015-02-23 DIAGNOSIS — N3000 Acute cystitis without hematuria: Secondary | ICD-10-CM

## 2015-02-23 LAB — POCT URINALYSIS DIPSTICK
Blood, UA: NEGATIVE
Glucose, UA: NEGATIVE
KETONES UA: NEGATIVE
PH UA: 6
PROTEIN UA: NEGATIVE
Urobilinogen, UA: 2

## 2015-02-23 MED ORDER — CIPROFLOXACIN HCL 500 MG PO TABS: 500.0000 mg | ORAL_TABLET | Freq: Two times a day (BID) | ORAL | Status: DC

## 2015-02-23 NOTE — Progress Notes (Signed)
Pre visit review using our clinic review tool, if applicable. No additional management support is needed unless otherwise documented below in the visit note. 

## 2015-02-23 NOTE — Progress Notes (Signed)
   Subjective:    Patient ID: Barbara Pittman, female    DOB: 07/23/1940, 74 y.o.   MRN: [id number]  HPI Here for 2 days of urinary burning and urgency. No fever or back pain. She had been feeling a bit ill for several weeks and was seen here last week. However a UA that day was clear. She is drinking water and using AZO.   Review of Systems  Constitutional: Negative.   Respiratory: Negative.   Cardiovascular: Negative.   Gastrointestinal: Negative.   Genitourinary: Positive for dysuria, urgency and frequency. Negative for hematuria, flank pain and pelvic pain.       Objective:   Physical Exam  Constitutional: She appears well-developed and well-nourished.  Cardiovascular: Normal rate, regular rhythm, normal heart sounds and intact distal pulses.   Pulmonary/Chest: Effort normal and breath sounds normal.  Abdominal: Soft. Bowel sounds are normal. She exhibits no distension and no mass. There is no tenderness. There is no rebound and no guarding.          Assessment & Plan:  UTI, treat with Cipro. Culture the sample

## 2015-02-25 LAB — URINE CULTURE
Colony Count: NO GROWTH
Organism ID, Bacteria: NO GROWTH

## 2015-03-30 ENCOUNTER — Telehealth: Payer: Self-pay | Admitting: Internal Medicine

## 2015-03-30 NOTE — Telephone Encounter (Signed)
Let pt know it was fine for her to have her flu shot.

## 2015-03-31 ENCOUNTER — Ambulatory Visit (INDEPENDENT_AMBULATORY_CARE_PROVIDER_SITE_OTHER): Payer: Medicare Other

## 2015-03-31 DIAGNOSIS — Z23 Encounter for immunization: Secondary | ICD-10-CM | POA: Diagnosis not present

## 2015-04-08 ENCOUNTER — Ambulatory Visit (AMBULATORY_SURGERY_CENTER): Payer: Self-pay

## 2015-04-08 VITALS — Ht 61.5 in | Wt 107.0 lb

## 2015-04-08 DIAGNOSIS — Z1211 Encounter for screening for malignant neoplasm of colon: Secondary | ICD-10-CM

## 2015-04-08 NOTE — Progress Notes (Signed)
No allergies to eggs or soy No past problems with anesthesia No home oxygen No diet/weight loss meds  Refused emmi 

## 2015-04-22 ENCOUNTER — Encounter: Payer: Self-pay | Admitting: Internal Medicine

## 2015-04-22 ENCOUNTER — Ambulatory Visit (AMBULATORY_SURGERY_CENTER): Payer: Medicare Other | Admitting: Internal Medicine

## 2015-04-22 VITALS — BP 129/47 | HR 52 | Temp 96.7°F | Resp 16 | Ht 61.0 in | Wt 107.0 lb

## 2015-04-22 DIAGNOSIS — Z1211 Encounter for screening for malignant neoplasm of colon: Secondary | ICD-10-CM | POA: Diagnosis not present

## 2015-04-22 HISTORY — PX: COLONOSCOPY: SHX174

## 2015-04-22 MED ORDER — SODIUM CHLORIDE 0.9 % IV SOLN: 500.0000 mL | INTRAVENOUS | Status: DC

## 2015-04-22 NOTE — Op Note (Signed)
[place]  [address] [place], [id number]   COLONOSCOPY PROCEDURE REPORT  PATIENT: Barbara, Pittman  MR#: [id number] BIRTHDATE: [birth year] , 49  yrs. old GENDER: female ENDOSCOPIST: Eustace Quail, MD REFERRED [id number] Recall, PROCEDURE DATE:  04/22/2015 PROCEDURE:   Colonoscopy, screening First Screening Colonoscopy - Avg.  risk and is 50 yrs.  old or older - No.  Prior Negative Screening - Now for repeat screening. 10 or more years since last screening  History of Adenoma - Now for follow-up colonoscopy & has been > or = to 3 yrs.  N/A  Polyps removed today? No Recommend repeat exam, <10 yrs? No ASA CLASS:   Class I INDICATIONS:Screening for colonic neoplasia and Colorectal Neoplasm Risk Assessment for this procedure is average risk.. Negative index examination September 2006 MEDICATIONS: Monitored anesthesia care and Propofol 150 mg IV  DESCRIPTION OF PROCEDURE:   After the risks benefits and alternatives of the procedure were thoroughly explained, informed consent was obtained.  The digital rectal exam revealed no abnormalities of the rectum.   The [id number]  endoscope was introduced through the anus and advanced to the cecum, which was identified by both the appendix and ileocecal valve. No adverse events experienced.   The quality of the prep was excellent. (MiraLax was used)  The instrument was then slowly withdrawn as the colon was fully examined. Estimated blood loss is zero unless otherwise noted in this procedure report.      COLON FINDINGS: There was moderate diverticulosis noted in the left colon.   The examination was otherwise normal.  Retroflexed views revealed external hemorrhoids. The time to cecum = 2.8 Withdrawal time = 6.6   The scope was withdrawn and the procedure completed. COMPLICATIONS: There were no immediate complications.  ENDOSCOPIC IMPRESSION: 1.   Moderate diverticulosis was noted in the left colon 2.    The examination was otherwise normal  RECOMMENDATIONS: 1. Return to the care of your primary provider.  GI follow up as needed  eSigned:  Eustace Quail, MD 04/22/2015 9:51 AM   cc: The Patient and Dorena Cookey, MD

## 2015-04-22 NOTE — Patient Instructions (Signed)
YOU HAD AN ENDOSCOPIC PROCEDURE TODAY AT THE  ENDOSCOPY CENTER:   Refer to the procedure report that was given to you for any specific questions about what was found during the examination.  If the procedure report does not answer your questions, please call your gastroenterologist to clarify.  If you requested that your care partner not be given the details of your procedure findings, then the procedure report has been included in a sealed envelope for you to review at your convenience later.  YOU SHOULD EXPECT: Some feelings of bloating in the abdomen. Passage of more gas than usual.  Walking can help get rid of the air that was put into your GI tract during the procedure and reduce the bloating. If you had a lower endoscopy (such as a colonoscopy or flexible sigmoidoscopy) you may notice spotting of blood in your stool or on the toilet paper. If you underwent a bowel prep for your procedure, you may not have a normal bowel movement for a few days.  Please Note:  You might notice some irritation and congestion in your nose or some drainage.  This is from the oxygen used during your procedure.  There is no need for concern and it should clear up in a day or so.  SYMPTOMS TO REPORT IMMEDIATELY:   Following lower endoscopy (colonoscopy or flexible sigmoidoscopy):  Excessive amounts of blood in the stool  Significant tenderness or worsening of abdominal pains  Swelling of the abdomen that is new, acute  Fever of 100F or higher   For urgent or emergent issues, a gastroenterologist can be reached at any hour by calling [phone].   DIET: Your first meal following the procedure should be a small meal and then it is ok to progress to your normal diet. Heavy or fried foods are harder to digest and may make you feel nauseous or bloated.  Likewise, meals heavy in dairy and vegetables can increase bloating.  Drink plenty of fluids but you should avoid alcoholic beverages for 24  hours.  ACTIVITY:  You should plan to take it easy for the rest of today and you should NOT DRIVE or use heavy machinery until tomorrow (because of the sedation medicines used during the test).    FOLLOW UP: Our staff will call the number listed on your records the next business day following your procedure to check on you and address any questions or concerns that you may have regarding the information given to you following your procedure. If we do not reach you, we will leave a message.  However, if you are feeling well and you are not experiencing any problems, there is no need to return our call.  We will assume that you have returned to your regular daily activities without incident.  If any biopsies were taken you will be contacted by phone or by letter within the next 1-3 weeks.  Please call us at [phone] if you have not heard about the biopsies in 3 weeks.    SIGNATURES/CONFIDENTIALITY: You and/or your care partner have signed paperwork which will be entered into your electronic medical record.  These signatures attest to the fact that that the information above on your After Visit Summary has been reviewed and is understood.  Full responsibility of the confidentiality of this discharge information lies with you and/or your care-partner.  Diverticulosis and high fiber diet information given. 

## 2015-04-22 NOTE — Progress Notes (Signed)
  [facility] Post-op Note  Patient: Barbara Pittman  Procedure(s) Performed: colonoscopy  Patient Location: [facility] - Recovery Area  Anesthesia Type: Deep Sedation/Propofol  Level of Consciousness: awake, oriented and patient cooperative  Airway and Oxygen Therapy: Patient Spontanous Breathing  Post-op Pain: none  Post-op Assessment:  Post-op Vital signs reviewed, Patient's Cardiovascular Status Stable, Respiratory Function Stable, Patent Airway, No signs of Nausea or vomiting and Pain level controlled  Post-op Vital Signs: Reviewed and stable  Complications: No apparent anesthesia complications  Atlas Kuc E 9:53 AM

## 2015-04-23 ENCOUNTER — Telehealth: Payer: Self-pay | Admitting: *Deleted

## 2015-04-23 NOTE — Telephone Encounter (Signed)
No answer. No identifier. Message left to call if questions or concerns. 

## 2015-05-01 DIAGNOSIS — H903 Sensorineural hearing loss, bilateral: Secondary | ICD-10-CM | POA: Diagnosis not present

## 2015-06-15 ENCOUNTER — Ambulatory Visit (INDEPENDENT_AMBULATORY_CARE_PROVIDER_SITE_OTHER): Payer: Medicare Other | Admitting: Family Medicine

## 2015-06-15 ENCOUNTER — Encounter: Payer: Self-pay | Admitting: Family Medicine

## 2015-06-15 VITALS — BP 110/74 | Temp 98.6°F | Ht 60.5 in | Wt 106.0 lb

## 2015-06-15 DIAGNOSIS — Z0000 Encounter for general adult medical examination without abnormal findings: Secondary | ICD-10-CM

## 2015-06-15 DIAGNOSIS — G43009 Migraine without aura, not intractable, without status migrainosus: Secondary | ICD-10-CM | POA: Diagnosis not present

## 2015-06-15 MED ORDER — SUMATRIPTAN SUCCINATE 50 MG PO TABS: 50.0000 mg | ORAL_TABLET | ORAL | Status: DC | PRN

## 2015-06-15 NOTE — Progress Notes (Signed)
Pre visit review using our clinic review tool, if applicable. No additional management support is needed unless otherwise documented below in the visit note. 

## 2015-06-15 NOTE — Patient Instructions (Signed)
Continue current medication for migraines  Return in one year for general physical exam sooner if any problems,,,,,,,,,,,, Tommi Rumps or [place] 2 new nurse practitioners or Dr. Martinique

## 2015-06-15 NOTE — Progress Notes (Signed)
   Subjective:    Patient ID: Barbara Pittman, female    DOB: Oct 25, 1940, 74 y.o.   MRN: [id number]  HPI  LC is a 74 year old single female nonsmoker who comes in today for general physical examination  She has a history of migraine headaches and takes Imitrex 50 mg when necessary. Past 12 months she's had about 6 migraines  She gets routine eye care, dental care, BSE monthly, annual mammography, colonoscopy 2016 normal  Vaccinations up-to-date  She had her uterus removed for nonmalignant reasons ovaries were left intact. No GI or pelvic symptoms  Cognitive function normal she walks daily home health safety reviewed no issues identified, no guns in the house, she has a healthcare power of attorney but no living well. Advised to get a living well   Review of Systems  Constitutional: Negative.   HENT: Negative.   Eyes: Negative.   Respiratory: Negative.   Cardiovascular: Negative.   Gastrointestinal: Negative.   Endocrine: Negative.   Genitourinary: Negative.   Musculoskeletal: Negative.   Skin: Negative.   Allergic/Immunologic: Negative.   Neurological: Negative.   Hematological: Negative.   Psychiatric/Behavioral: Negative.        Objective:   Physical Exam  Constitutional: She appears well-developed and well-nourished.  HENT:  Head: Normocephalic and atraumatic.  Right Ear: External ear normal.  Left Ear: External ear normal.  Nose: Nose normal.  Mouth/Throat: Oropharynx is clear and moist.  Eyes: EOM are normal. Pupils are equal, round, and reactive to light.  Neck: Normal range of motion. Neck supple. No JVD present. No tracheal deviation present. No thyromegaly present.  Cardiovascular: Normal rate, regular rhythm, normal heart sounds and intact distal pulses.  Exam reveals no gallop and no friction rub.   No murmur heard. Pulmonary/Chest: Effort normal and breath sounds normal. No stridor. No respiratory distress. She has no wheezes. She has no rales. She exhibits  no tenderness.  Abdominal: Soft. Bowel sounds are normal. She exhibits no distension and no mass. There is no tenderness. There is no rebound and no guarding.  Genitourinary:  Bilateral breast exam normal  Musculoskeletal: Normal range of motion.  Lymphadenopathy:    She has no cervical adenopathy.  Neurological: She is alert. She has normal reflexes. No cranial nerve deficit. She exhibits normal muscle tone. Coordination normal.  Skin: Skin is warm and dry. No rash noted. No erythema. No pallor.  Psychiatric: She has a normal mood and affect. Her behavior is normal. Judgment and thought content normal.  Nursing note and vitals reviewed.         Assessment & Plan:  Healthy female  Migraine headaches............ continue Imitrex when necessary  Living willokay so if you are urine my computer the uvula he goes through physical exam progress notes in [facility] your check Y you are [facility] K unit review of systems and is spread all over the page wants somebody's been in my computer unchanged I wanted smaller like it was before he is

## 2015-06-23 NOTE — Addendum Note (Signed)
Addended by: Steva Ready on: 06/23/2015 02:51 PM   Modules accepted: Level of Service

## 2015-07-08 ENCOUNTER — Encounter: Payer: Self-pay | Admitting: Internal Medicine

## 2015-09-08 ENCOUNTER — Other Ambulatory Visit: Payer: Self-pay

## 2015-09-08 DIAGNOSIS — Z1231 Encounter for screening mammogram for malignant neoplasm of breast: Secondary | ICD-10-CM

## 2015-10-14 ENCOUNTER — Ambulatory Visit
Admission: RE | Admit: 2015-10-14 | Discharge: 2015-10-14 | Disposition: A | Discharge status: Home / Self Care | Payer: Medicare Other | Source: Ambulatory Visit

## 2015-10-14 DIAGNOSIS — Z1231 Encounter for screening mammogram for malignant neoplasm of breast: Secondary | ICD-10-CM

## 2016-03-21 ENCOUNTER — Ambulatory Visit (INDEPENDENT_AMBULATORY_CARE_PROVIDER_SITE_OTHER): Payer: Medicare Other | Admitting: Emergency Medicine

## 2016-03-21 DIAGNOSIS — Z23 Encounter for immunization: Secondary | ICD-10-CM | POA: Diagnosis not present

## 2016-04-01 ENCOUNTER — Observation Stay (HOSPITAL_BASED_OUTPATIENT_CLINIC_OR_DEPARTMENT_OTHER)
Admission: EM | Admit: 2016-04-01 | Discharge: 2016-04-02 | Disposition: A | Discharge status: Home / Self Care | Payer: Medicare Other | Attending: Internal Medicine | Admitting: Internal Medicine

## 2016-04-01 ENCOUNTER — Emergency Department (HOSPITAL_BASED_OUTPATIENT_CLINIC_OR_DEPARTMENT_OTHER): Payer: Medicare Other

## 2016-04-01 ENCOUNTER — Encounter (HOSPITAL_BASED_OUTPATIENT_CLINIC_OR_DEPARTMENT_OTHER): Payer: Self-pay | Admitting: *Deleted

## 2016-04-01 ENCOUNTER — Telehealth: Payer: Self-pay | Admitting: Family Medicine

## 2016-04-01 DIAGNOSIS — Z806 Family history of leukemia: Secondary | ICD-10-CM | POA: Diagnosis not present

## 2016-04-01 DIAGNOSIS — Z87892 Personal history of anaphylaxis: Secondary | ICD-10-CM | POA: Insufficient documentation

## 2016-04-01 DIAGNOSIS — R4182 Altered mental status, unspecified: Secondary | ICD-10-CM

## 2016-04-01 DIAGNOSIS — G43909 Migraine, unspecified, not intractable, without status migrainosus: Secondary | ICD-10-CM | POA: Diagnosis present

## 2016-04-01 DIAGNOSIS — Z888 Allergy status to other drugs, medicaments and biological substances status: Secondary | ICD-10-CM | POA: Insufficient documentation

## 2016-04-01 DIAGNOSIS — Z9889 Other specified postprocedural states: Secondary | ICD-10-CM | POA: Diagnosis not present

## 2016-04-01 DIAGNOSIS — M810 Age-related osteoporosis without current pathological fracture: Secondary | ICD-10-CM | POA: Diagnosis not present

## 2016-04-01 DIAGNOSIS — D649 Anemia, unspecified: Secondary | ICD-10-CM | POA: Diagnosis not present

## 2016-04-01 DIAGNOSIS — I700 Atherosclerosis of aorta: Secondary | ICD-10-CM | POA: Insufficient documentation

## 2016-04-01 DIAGNOSIS — Z800 Family history of malignant neoplasm of digestive organs: Secondary | ICD-10-CM | POA: Diagnosis not present

## 2016-04-01 DIAGNOSIS — Z90710 Acquired absence of both cervix and uterus: Secondary | ICD-10-CM | POA: Diagnosis not present

## 2016-04-01 DIAGNOSIS — Z881 Allergy status to other antibiotic agents status: Secondary | ICD-10-CM | POA: Diagnosis not present

## 2016-04-01 DIAGNOSIS — Z8249 Family history of ischemic heart disease and other diseases of the circulatory system: Secondary | ICD-10-CM | POA: Diagnosis not present

## 2016-04-01 DIAGNOSIS — Z8052 Family history of malignant neoplasm of bladder: Secondary | ICD-10-CM | POA: Insufficient documentation

## 2016-04-01 DIAGNOSIS — R55 Syncope and collapse: Principal | ICD-10-CM | POA: Diagnosis present

## 2016-04-01 DIAGNOSIS — Z87442 Personal history of urinary calculi: Secondary | ICD-10-CM | POA: Diagnosis not present

## 2016-04-01 DIAGNOSIS — Z79899 Other long term (current) drug therapy: Secondary | ICD-10-CM | POA: Diagnosis not present

## 2016-04-01 DIAGNOSIS — Z9849 Cataract extraction status, unspecified eye: Secondary | ICD-10-CM | POA: Insufficient documentation

## 2016-04-01 DIAGNOSIS — Z833 Family history of diabetes mellitus: Secondary | ICD-10-CM | POA: Diagnosis not present

## 2016-04-01 DIAGNOSIS — K219 Gastro-esophageal reflux disease without esophagitis: Secondary | ICD-10-CM | POA: Diagnosis present

## 2016-04-01 LAB — CBG MONITORING, ED: GLUCOSE-CAPILLARY: 124 mg/dL — AB (ref 65–99)

## 2016-04-01 LAB — URINALYSIS, ROUTINE W REFLEX MICROSCOPIC
Bilirubin Urine: NEGATIVE
GLUCOSE, UA: NEGATIVE mg/dL
HGB URINE DIPSTICK: NEGATIVE
KETONES UR: NEGATIVE mg/dL
Nitrite: NEGATIVE
PH: 6.5 (ref 5.0–8.0)
PROTEIN: NEGATIVE mg/dL
Specific Gravity, Urine: 1.011 (ref 1.005–1.030)

## 2016-04-01 LAB — CBC WITH DIFFERENTIAL/PLATELET
BASOS PCT: 1 %
Basophils Absolute: 0.1 10*3/uL (ref 0.0–0.1)
EOS ABS: 0.1 10*3/uL (ref 0.0–0.7)
EOS PCT: 2 %
HCT: 35.1 % — ABNORMAL LOW (ref 36.0–46.0)
Hemoglobin: 11.4 g/dL — ABNORMAL LOW (ref 12.0–15.0)
LYMPHS ABS: 1.8 10*3/uL (ref 0.7–4.0)
Lymphocytes Relative: 30 %
MCH: 27.9 pg (ref 26.0–34.0)
MCHC: 32.5 g/dL (ref 30.0–36.0)
MCV: 86 fL (ref 78.0–100.0)
MONO ABS: 0.3 10*3/uL (ref 0.1–1.0)
Monocytes Relative: 5 %
NEUTROS ABS: 3.8 10*3/uL (ref 1.7–7.7)
NEUTROS PCT: 62 %
PLATELETS: 201 10*3/uL (ref 150–400)
RBC: 4.08 MIL/uL (ref 3.87–5.11)
RDW: 13.7 % (ref 11.5–15.5)
WBC: 6.1 10*3/uL (ref 4.0–10.5)

## 2016-04-01 LAB — BASIC METABOLIC PANEL
ANION GAP: 5 (ref 5–15)
BUN: 15 mg/dL (ref 6–20)
CALCIUM: 8.8 mg/dL — AB (ref 8.9–10.3)
CO2: 26 mmol/L (ref 22–32)
CREATININE: 0.6 mg/dL (ref 0.44–1.00)
Chloride: 107 mmol/L (ref 101–111)
GFR calc Af Amer: >60 mL/min (ref >60)
GLUCOSE: 107 mg/dL — AB (ref 65–99)
Potassium: 3.5 mmol/L (ref 3.5–5.1)
Sodium: 138 mmol/L (ref 135–145)

## 2016-04-01 LAB — URINE MICROSCOPIC-ADD ON: RBC / HPF: NONE SEEN RBC/hpf (ref 0–5)

## 2016-04-01 LAB — TROPONIN I: Troponin I: <0.03 ng/mL (ref <0.03)

## 2016-04-01 MED ORDER — SODIUM CHLORIDE 0.9% FLUSH
3.0000 mL | Freq: Two times a day (BID) | INTRAVENOUS | Status: DC
  Administered 2016-04-02: 3 mL via INTRAVENOUS

## 2016-04-01 MED ORDER — POTASSIUM CHLORIDE IN NACL 40-0.9 MEQ/L-% IV SOLN
INTRAVENOUS | Status: AC
  Administered 2016-04-02: 125 mL/h via INTRAVENOUS
  Filled 2016-04-01: qty 1000

## 2016-04-01 MED ORDER — FAMOTIDINE 20 MG PO TABS
20.0000 mg | ORAL_TABLET | Freq: Two times a day (BID) | ORAL | Status: DC
  Filled 2016-04-01 (×2): qty 1

## 2016-04-01 MED ORDER — ENOXAPARIN SODIUM 40 MG/0.4ML ~~LOC~~ SOLN
40.0000 mg | Freq: Every day | SUBCUTANEOUS | Status: DC
  Filled 2016-04-01: qty 0.4

## 2016-04-01 NOTE — Telephone Encounter (Signed)
FYI.  Pt seen in ED 

## 2016-04-01 NOTE — ED Notes (Signed)
Family at bedside. 
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
## 2016-04-01 NOTE — ED Notes (Signed)
Patient denies pain and is resting comfortably.  
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
## 2016-04-01 NOTE — Progress Notes (Signed)
Patient received via [facility] from [facility] and oriented to room. Tele monitoring and skin assessment completed. Patient given healthy choice meal, no other needs at this time. Physician paged. Call light within reach.

## 2016-04-02 ENCOUNTER — Encounter (HOSPITAL_COMMUNITY): Payer: Self-pay | Admitting: Cardiology

## 2016-04-02 ENCOUNTER — Observation Stay (HOSPITAL_BASED_OUTPATIENT_CLINIC_OR_DEPARTMENT_OTHER): Payer: Medicare Other

## 2016-04-02 ENCOUNTER — Observation Stay (HOSPITAL_COMMUNITY): Payer: Medicare Other

## 2016-04-02 DIAGNOSIS — D649 Anemia, unspecified: Secondary | ICD-10-CM | POA: Diagnosis not present

## 2016-04-02 DIAGNOSIS — Z888 Allergy status to other drugs, medicaments and biological substances status: Secondary | ICD-10-CM | POA: Diagnosis not present

## 2016-04-02 DIAGNOSIS — Z90710 Acquired absence of both cervix and uterus: Secondary | ICD-10-CM | POA: Diagnosis not present

## 2016-04-02 DIAGNOSIS — R4182 Altered mental status, unspecified: Secondary | ICD-10-CM | POA: Diagnosis not present

## 2016-04-02 DIAGNOSIS — K219 Gastro-esophageal reflux disease without esophagitis: Secondary | ICD-10-CM | POA: Diagnosis not present

## 2016-04-02 DIAGNOSIS — Z9849 Cataract extraction status, unspecified eye: Secondary | ICD-10-CM | POA: Diagnosis not present

## 2016-04-02 DIAGNOSIS — R55 Syncope and collapse: Secondary | ICD-10-CM | POA: Diagnosis not present

## 2016-04-02 DIAGNOSIS — M810 Age-related osteoporosis without current pathological fracture: Secondary | ICD-10-CM | POA: Diagnosis not present

## 2016-04-02 DIAGNOSIS — I700 Atherosclerosis of aorta: Secondary | ICD-10-CM | POA: Diagnosis not present

## 2016-04-02 DIAGNOSIS — G43909 Migraine, unspecified, not intractable, without status migrainosus: Secondary | ICD-10-CM | POA: Diagnosis not present

## 2016-04-02 DIAGNOSIS — Z79899 Other long term (current) drug therapy: Secondary | ICD-10-CM | POA: Diagnosis not present

## 2016-04-02 DIAGNOSIS — Z87442 Personal history of urinary calculi: Secondary | ICD-10-CM | POA: Diagnosis not present

## 2016-04-02 DIAGNOSIS — Z881 Allergy status to other antibiotic agents status: Secondary | ICD-10-CM | POA: Diagnosis not present

## 2016-04-02 LAB — VAS US CAROTID
LCCADDIAS: -21 cm/s
LCCADSYS: -67 cm/s
LEFT ECA DIAS: -16 cm/s
LEFT VERTEBRAL DIAS: 11 cm/s
LICAPSYS: -66 cm/s
Left CCA prox dias: 14 cm/s
Left CCA prox sys: 81 cm/s
Left ICA dist dias: -28 cm/s
Left ICA dist sys: -89 cm/s
Left ICA prox dias: -19 cm/s
RCCADSYS: -119 cm/s
RIGHT ECA DIAS: -8 cm/s
Right CCA prox dias: 11 cm/s
Right CCA prox sys: 81 cm/s

## 2016-04-02 LAB — MAGNESIUM: MAGNESIUM: 2.1 mg/dL (ref 1.7–2.4)

## 2016-04-02 LAB — GLUCOSE, CAPILLARY
GLUCOSE-CAPILLARY: 90 mg/dL (ref 65–99)
GLUCOSE-CAPILLARY: 108 mg/dL — AB (ref 65–99)
Glucose-Capillary: 109 mg/dL — ABNORMAL HIGH (ref 65–99)

## 2016-04-02 LAB — CBC
HCT: 37.6 % (ref 36.0–46.0)
Hemoglobin: 11.7 g/dL — ABNORMAL LOW (ref 12.0–15.0)
MCH: 27.3 pg (ref 26.0–34.0)
MCHC: 31.1 g/dL (ref 30.0–36.0)
MCV: 87.6 fL (ref 78.0–100.0)
PLATELETS: 260 10*3/uL (ref 150–400)
RBC: 4.29 MIL/uL (ref 3.87–5.11)
RDW: 13.9 % (ref 11.5–15.5)
WBC: 6.5 10*3/uL (ref 4.0–10.5)

## 2016-04-02 LAB — TROPONIN I: Troponin I: <0.03 ng/mL (ref <0.03)

## 2016-04-02 LAB — ECHOCARDIOGRAM COMPLETE
Height: 61 in
Weight: 1684.81 oz

## 2016-04-02 LAB — CREATININE, SERUM
CREATININE: 0.64 mg/dL (ref 0.44–1.00)
GFR calc Af Amer: >60 mL/min (ref >60)
GFR calc non Af Amer: >60 mL/min (ref >60)

## 2016-04-02 LAB — RETICULOCYTES
RBC.: 4.29 MIL/uL (ref 3.87–5.11)
RETIC COUNT ABSOLUTE: 34.3 10*3/uL (ref 19.0–186.0)
RETIC CT PCT: 0.8 % (ref 0.4–3.1)

## 2016-04-02 LAB — IRON AND TIBC
Iron: 52 ug/dL (ref 28–170)
SATURATION RATIOS: 18 % (ref 10.4–31.8)
TIBC: 284 ug/dL (ref 250–450)
UIBC: 232 ug/dL

## 2016-04-02 LAB — FERRITIN: Ferritin: 160 ng/mL (ref 11–307)

## 2016-04-02 LAB — FOLATE: Folate: 19 ng/mL (ref >5.9)

## 2016-04-02 LAB — VITAMIN B12: VITAMIN B 12: 343 pg/mL (ref 180–914)

## 2016-04-02 NOTE — Progress Notes (Signed)
  Echocardiogram 2D Echocardiogram has been performed.  Donata Clay 04/02/2016, 12:55 PM
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
## 2016-04-02 NOTE — Progress Notes (Signed)
*  PRELIMINARY RESULTS* Vascular Ultrasound Carotid Duplex (Doppler) has been completed. There is no obvious evidence of hemodynamically significant internal carotid artery stenosis bilaterally. Vertebral arteries are patent with antegrade flow.  04/02/2016 11:54 AM Maudry Mayhew, BS, RVT, RDCS, RDMS
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
## 2016-04-02 NOTE — Progress Notes (Signed)
Order to discharge received.  IV and telemetry removed, CCMD notified.

## 2016-04-04 DIAGNOSIS — R55 Syncope and collapse: Secondary | ICD-10-CM | POA: Diagnosis not present

## 2016-04-04 DIAGNOSIS — K219 Gastro-esophageal reflux disease without esophagitis: Secondary | ICD-10-CM | POA: Diagnosis not present

## 2016-04-05 ENCOUNTER — Encounter: Payer: Self-pay | Admitting: Family Medicine

## 2016-04-05 ENCOUNTER — Ambulatory Visit (INDEPENDENT_AMBULATORY_CARE_PROVIDER_SITE_OTHER): Payer: Medicare Other | Admitting: Family Medicine

## 2016-04-05 VITALS — BP 100/64 | HR 65 | Temp 97.7°F | Wt 105.0 lb

## 2016-04-05 DIAGNOSIS — R404 Transient alteration of awareness: Secondary | ICD-10-CM | POA: Diagnosis not present
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
## 2016-04-05 NOTE — Patient Instructions (Signed)
Return when necessary 

## 2016-04-05 NOTE — Progress Notes (Signed)
Pre visit review using our clinic review tool, if applicable. No additional management support is needed unless otherwise documented below in the visit note. 

## 2016-04-18 ENCOUNTER — Encounter: Payer: Self-pay | Admitting: Cardiology

## 2016-04-18 DIAGNOSIS — K219 Gastro-esophageal reflux disease without esophagitis: Secondary | ICD-10-CM | POA: Diagnosis not present

## 2016-04-18 DIAGNOSIS — R55 Syncope and collapse: Secondary | ICD-10-CM | POA: Diagnosis not present

## 2016-04-18 DIAGNOSIS — R42 Dizziness and giddiness: Secondary | ICD-10-CM | POA: Diagnosis not present

## 2016-07-25 ENCOUNTER — Encounter: Payer: Medicare Other | Admitting: Family Medicine

## 2016-08-29 ENCOUNTER — Other Ambulatory Visit: Payer: Self-pay | Admitting: Family Medicine

## 2016-08-29 DIAGNOSIS — Z1231 Encounter for screening mammogram for malignant neoplasm of breast: Secondary | ICD-10-CM

## 2016-09-13 DIAGNOSIS — H903 Sensorineural hearing loss, bilateral: Secondary | ICD-10-CM | POA: Diagnosis not present

## 2016-09-28 ENCOUNTER — Ambulatory Visit (INDEPENDENT_AMBULATORY_CARE_PROVIDER_SITE_OTHER): Payer: Medicare Other | Admitting: Family Medicine

## 2016-09-28 ENCOUNTER — Encounter: Payer: Self-pay | Admitting: Family Medicine

## 2016-09-28 VITALS — BP 110/80 | HR 54 | Temp 98.6°F | Wt 112.0 lb

## 2016-09-28 DIAGNOSIS — J209 Acute bronchitis, unspecified: Secondary | ICD-10-CM

## 2016-09-28 NOTE — Progress Notes (Signed)
Pre visit review using our clinic review tool, if applicable. No additional management support is needed unless otherwise documented below in the visit note. 

## 2016-09-28 NOTE — Progress Notes (Signed)
Subjective:     Patient ID: Barbara Pittman, female   DOB: Sep 03, 1940, 76 y.o.   MRN: [id number]  HPI Patient is nonsmoker who is seen with onset last Thursday of mostly dry cough. No fevers or chills. No dyspnea. No hemoptysis. Minimal postnasal drip symptoms. Does not feel particularly ill. No sick contacts.  Cough is relatively mild.    Past Medical History:  Diagnosis Date  . Allergy   . Kidney stone   . Migraine   . Osteoporosis    Past Surgical History:  Procedure Laterality Date  . ABDOMINAL HYSTERECTOMY    . BUNIONECTOMY    . CATARACT EXTRACTION    . TONSILLECTOMY      reports that she has never smoked. She has never used smokeless tobacco. She reports that she does not drink alcohol or use drugs. family history includes Cancer in her brother and brother; Diabetes in her brother; Leukemia in her mother. Allergies  Allergen Reactions  . Cephalexin Itching and Swelling    Swollen tongue and itching ears  . Nitrofurantoin Rash     Review of Systems  Constitutional: Negative for chills, fatigue and fever.  HENT: Positive for postnasal drip.   Respiratory: Positive for cough.        Objective:   Physical Exam  Constitutional: She appears well-developed and well-nourished.  HENT:  Right Ear: External ear normal.  Left Ear: External ear normal.  Mouth/Throat: Oropharynx is clear and moist.  Neck: Neck supple.  Cardiovascular: Normal rate and regular rhythm.   Pulmonary/Chest: Effort normal and breath sounds normal. No respiratory distress. She has no wheezes. She has no rales.  Lymphadenopathy:    She has no cervical adenopathy.       Assessment:     Cough. Suspect acute viral bronchitis. Nonfocal exam    Plan:     -Treat symptomatically with over-the-counter cough medication -Follow-up promptly for any fever, increased shortness of breath, or other concern  Eulas Post MD [facility]'

## 2016-09-28 NOTE — Patient Instructions (Signed)

## 2016-10-04 ENCOUNTER — Encounter: Payer: Self-pay | Admitting: Family Medicine

## 2016-10-04 ENCOUNTER — Ambulatory Visit (INDEPENDENT_AMBULATORY_CARE_PROVIDER_SITE_OTHER): Payer: Medicare Other | Admitting: Family Medicine

## 2016-10-04 VITALS — BP 120/78 | HR 59 | Temp 98.5°F | Wt 109.5 lb

## 2016-10-04 DIAGNOSIS — J209 Acute bronchitis, unspecified: Secondary | ICD-10-CM

## 2016-10-04 NOTE — Progress Notes (Signed)
Subjective:     Patient ID: Barbara Pittman, female   DOB: 06/12/1941, 76 y.o.   MRN: [id number]  HPI Patient seen for follow-up regarding cough. She was seen last week with nonfocal exam. Over the weekend she had a couple episodes where she was going upstairs and felt slightly dyspneic. Her cough has been occasionally productive of mostly dry. Not interfering with sleep. She's had no fever whatsoever. No night sweats. Generally feels well and continues not to feel very ill overall. No orthopnea. No chest pains. No pleuritic pain. Appetite is normal. No chronic lung disease. Never smoked.  Past Medical History:  Diagnosis Date  . Allergy   . Kidney stone   . Migraine   . Osteoporosis    Past Surgical History:  Procedure Laterality Date  . ABDOMINAL HYSTERECTOMY    . BUNIONECTOMY    . CATARACT EXTRACTION    . TONSILLECTOMY      reports that she has never smoked. She has never used smokeless tobacco. She reports that she does not drink alcohol or use drugs. family history includes Cancer in her brother and brother; Diabetes in her brother; Leukemia in her mother. Allergies  Allergen Reactions  . Cephalexin Itching and Swelling    Swollen tongue and itching ears  . Nitrofurantoin Rash     Review of Systems  Constitutional: Negative for chills and fever.  Respiratory: Positive for cough. Negative for wheezing.   Cardiovascular: Negative for chest pain, palpitations and leg swelling.  Neurological: Negative for dizziness, syncope and weakness.       Objective:   Physical Exam  Constitutional: She appears well-developed and well-nourished.  HENT:  Right Ear: External ear normal.  Left Ear: External ear normal.  Mouth/Throat: Oropharynx is clear and moist. No oropharyngeal exudate.  Neck: Neck supple.  Cardiovascular: Normal rate and regular rhythm.   Pulmonary/Chest: Effort normal and breath sounds normal. No respiratory distress. She has no wheezes. She has no rales.   Musculoskeletal: She exhibits no edema.  Lymphadenopathy:    She has no cervical adenopathy.       Assessment:     Cough. Afebrile and normal pulse oximetry with normal exam. Suspect acute viral bronchitis    Plan:     -Consider over-the-counter Mucinex -Stay well-hydrated -Follow-up for any fever or change of symptoms  Eulas Post MD [facility]

## 2016-10-04 NOTE — Patient Instructions (Signed)
Stay well hydrated Consider OTC plain Mucinex Follow up for any fever or other concerns.

## 2016-10-04 NOTE — Progress Notes (Signed)
Pre visit review using our clinic review tool, if applicable. No additional management support is needed unless otherwise documented below in the visit note. 

## 2016-10-17 ENCOUNTER — Ambulatory Visit
Admission: RE | Admit: 2016-10-17 | Discharge: 2016-10-17 | Disposition: A | Discharge status: Home / Self Care | Payer: Medicare Other | Source: Ambulatory Visit | Attending: Family Medicine | Admitting: Family Medicine

## 2016-10-17 DIAGNOSIS — Z1231 Encounter for screening mammogram for malignant neoplasm of breast: Secondary | ICD-10-CM

## 2016-12-02 ENCOUNTER — Other Ambulatory Visit: Payer: Self-pay | Admitting: Family Medicine

## 2016-12-02 DIAGNOSIS — G43009 Migraine without aura, not intractable, without status migrainosus: Secondary | ICD-10-CM

## 2016-12-02 NOTE — Telephone Encounter (Signed)
Sent to the pharmacy by e-scribe. 

## 2016-12-02 NOTE — Telephone Encounter (Signed)
Ok to fill or have the pt come in?

## 2016-12-02 NOTE — Telephone Encounter (Signed)
Ok to refill 

## 2017-03-08 ENCOUNTER — Encounter: Payer: Self-pay | Admitting: Family Medicine

## 2017-03-08 ENCOUNTER — Ambulatory Visit (INDEPENDENT_AMBULATORY_CARE_PROVIDER_SITE_OTHER): Payer: Medicare Other | Admitting: Family Medicine

## 2017-03-08 VITALS — BP 110/68 | Temp 98.5°F | Ht 61.0 in | Wt 108.0 lb

## 2017-03-08 DIAGNOSIS — Z23 Encounter for immunization: Secondary | ICD-10-CM | POA: Diagnosis not present

## 2017-03-08 DIAGNOSIS — K219 Gastro-esophageal reflux disease without esophagitis: Secondary | ICD-10-CM | POA: Diagnosis not present

## 2017-03-08 DIAGNOSIS — G43001 Migraine without aura, not intractable, with status migrainosus: Secondary | ICD-10-CM

## 2017-03-08 DIAGNOSIS — M810 Age-related osteoporosis without current pathological fracture: Secondary | ICD-10-CM | POA: Diagnosis not present

## 2017-03-08 DIAGNOSIS — E782 Mixed hyperlipidemia: Secondary | ICD-10-CM | POA: Diagnosis not present

## 2017-03-08 LAB — CBC WITH DIFFERENTIAL/PLATELET
BASOS PCT: 0.5 % (ref 0.0–3.0)
Basophils Absolute: 0 10*3/uL (ref 0.0–0.1)
EOS ABS: 0.1 10*3/uL (ref 0.0–0.7)
EOS PCT: 0.8 % (ref 0.0–5.0)
HCT: 38.3 % (ref 36.0–46.0)
HEMOGLOBIN: 12.4 g/dL (ref 12.0–15.0)
LYMPHS ABS: 1 10*3/uL (ref 0.7–4.0)
Lymphocytes Relative: 15.5 % (ref 12.0–46.0)
MCHC: 32.5 g/dL (ref 30.0–36.0)
MCV: 85.3 fl (ref 78.0–100.0)
MONO ABS: 0.4 10*3/uL (ref 0.1–1.0)
Monocytes Relative: 6.7 % (ref 3.0–12.0)
NEUTROS ABS: 4.9 10*3/uL (ref 1.4–7.7)
NEUTROS PCT: 76.5 % (ref 43.0–77.0)
PLATELETS: 218 10*3/uL (ref 150.0–400.0)
RBC: 4.49 Mil/uL (ref 3.87–5.11)
RDW: 14.9 % (ref 11.5–15.5)
WBC: 6.4 10*3/uL (ref 4.0–10.5)

## 2017-03-08 LAB — POC URINALSYSI DIPSTICK (AUTOMATED)
Bilirubin, UA: NEGATIVE
GLUCOSE UA: NEGATIVE
Leukocytes, UA: NEGATIVE
Nitrite, UA: NEGATIVE
Protein, UA: NEGATIVE
UROBILINOGEN UA: 0.2 U/dL
pH, UA: 6 (ref 5.0–8.0)

## 2017-03-08 LAB — HEPATIC FUNCTION PANEL
ALBUMIN: 4.5 g/dL (ref 3.5–5.2)
ALK PHOS: 66 U/L (ref 39–117)
ALT: 14 U/L (ref 0–35)
AST: 16 U/L (ref 0–37)
BILIRUBIN DIRECT: 0.3 mg/dL (ref 0.0–0.3)
TOTAL PROTEIN: 6.9 g/dL (ref 6.0–8.3)
Total Bilirubin: 2 mg/dL — ABNORMAL HIGH (ref 0.2–1.2)

## 2017-03-08 LAB — LIPID PANEL
CHOLESTEROL: 205 mg/dL — AB (ref 0–200)
HDL: 54.1 mg/dL (ref >39.00)
LDL Cholesterol: 131 mg/dL — ABNORMAL HIGH (ref 0–99)
NONHDL: 150.84
Total CHOL/HDL Ratio: 4
Triglycerides: 98 mg/dL (ref 0.0–149.0)
VLDL: 19.6 mg/dL (ref 0.0–40.0)

## 2017-03-08 LAB — BASIC METABOLIC PANEL
BUN: 23 mg/dL (ref 6–23)
CALCIUM: 9.8 mg/dL (ref 8.4–10.5)
CHLORIDE: 105 meq/L (ref 96–112)
CO2: 28 meq/L (ref 19–32)
CREATININE: 0.61 mg/dL (ref 0.40–1.20)
GFR: 101.25 mL/min (ref >60.00)
GLUCOSE: 99 mg/dL (ref 70–99)
Potassium: 4.9 mEq/L (ref 3.5–5.1)
SODIUM: 143 meq/L (ref 135–145)

## 2017-03-08 LAB — TSH: TSH: 1.27 u[IU]/mL (ref 0.35–4.50)

## 2017-03-08 NOTE — Patient Instructions (Signed)
WE NOW OFFER   [facility] FAST TRACK!!!  SAME DAY Appointments for ACUTE CARE  Such as: Sprains, Injuries, cuts, abrasions, rashes, muscle pain, joint pain, back pain Colds, flu, sore throats, headache, allergies, cough, fever  Ear pain, sinus and eye infections Abdominal pain, nausea, vomiting, diarrhea, upset stomach Animal/insect bites  3 Easy Ways to Schedule: Walk-In Scheduling Call in scheduling Mychart Sign-up: [email or web address]         

## 2017-03-08 NOTE — Progress Notes (Signed)
   Subjective:    Patient ID: Barbara Pittman, female    DOB: May 05, 1941, 76 y.o.   MRN: [id number]  HPI Here to establish with [place] and to discuss some issues. She feels well in general. She exercises at the [facility] 3 days a week. She has migraine headaches very infrequently and Sumatriptan works well when she needs it. She has a hx of osteoporosis but her last DEXA was in 2013. She has never taken a bisphosphinate but she use to take calcium and vitamin D. She asks if she should start taking this again. She also asks if she should take aspirin daily. She had a workup in the ER in October 2017 for a brief presyncopal episode but no etiology was found. Her EKG was normal. Carotid dopplers were normal. Her ECHO was normal with an EF of 60-65%. She gets yearly mammograms.    Review of Systems  Constitutional: Negative.   HENT: Negative.   Eyes: Negative.   Respiratory: Negative.   Cardiovascular: Negative.   Gastrointestinal: Negative.   Genitourinary: Negative for decreased urine volume, difficulty urinating, dyspareunia, dysuria, enuresis, flank pain, frequency, hematuria, pelvic pain and urgency.  Musculoskeletal: Negative.   Skin: Negative.   Neurological: Positive for headaches. Negative for dizziness, tremors, seizures, syncope, facial asymmetry, speech difficulty, weakness, light-headedness and numbness.  Psychiatric/Behavioral: Negative.        Objective:   Physical Exam  Constitutional: She is oriented to person, place, and time. She appears well-developed and well-nourished. No distress.  HENT:  Head: Normocephalic and atraumatic.  Right Ear: External ear normal.  Left Ear: External ear normal.  Nose: Nose normal.  Mouth/Throat: Oropharynx is clear and moist. No oropharyngeal exudate.  Eyes: Pupils are equal, round, and reactive to light. Conjunctivae and EOM are normal. No scleral icterus.  Neck: Normal range of motion. Neck supple. No JVD present. No thyromegaly present.    Cardiovascular: Normal rate, regular rhythm, normal heart sounds and intact distal pulses.  Exam reveals no gallop and no friction rub.   No murmur heard. Pulmonary/Chest: Effort normal and breath sounds normal. No respiratory distress. She has no wheezes. She has no rales. She exhibits no tenderness.  Abdominal: Soft. Bowel sounds are normal. She exhibits no distension and no mass. There is no tenderness. There is no rebound and no guarding.  Musculoskeletal: Normal range of motion. She exhibits no edema or tenderness.  Lymphadenopathy:    She has no cervical adenopathy.  Neurological: She is alert and oriented to person, place, and time. She has normal reflexes. No cranial nerve deficit. She exhibits normal muscle tone. Coordination normal.  Skin: Skin is warm and dry. No rash noted. No erythema.  Psychiatric: She has a normal mood and affect. Her behavior is normal. Judgment and thought content normal.          Assessment & Plan:  Introductory visit for this patient who seems to be healthy. We will get fasting labs today including a lipid panel. Her migraines are well controlled. We will set up another DEXA soon, but in the meantime I advised her to start back on calcium and vitamin D supplements. I advised her to not take daily aspirin, since her cardiovascular risks are not elevated.  Alysia Penna, MD

## 2017-03-16 ENCOUNTER — Ambulatory Visit (INDEPENDENT_AMBULATORY_CARE_PROVIDER_SITE_OTHER)
Admission: RE | Admit: 2017-03-16 | Discharge: 2017-03-16 | Disposition: A | Discharge status: Home / Self Care | Payer: Medicare Other | Source: Ambulatory Visit | Attending: Family Medicine | Admitting: Family Medicine

## 2017-03-16 DIAGNOSIS — M810 Age-related osteoporosis without current pathological fracture: Secondary | ICD-10-CM | POA: Diagnosis not present

## 2017-03-24 ENCOUNTER — Other Ambulatory Visit: Payer: Self-pay | Admitting: Family Medicine

## 2017-03-24 NOTE — Telephone Encounter (Signed)
See bone density results.

## 2017-04-26 DIAGNOSIS — Z961 Presence of intraocular lens: Secondary | ICD-10-CM | POA: Diagnosis not present

## 2017-04-26 DIAGNOSIS — H524 Presbyopia: Secondary | ICD-10-CM | POA: Diagnosis not present

## 2017-04-26 DIAGNOSIS — H04123 Dry eye syndrome of bilateral lacrimal glands: Secondary | ICD-10-CM | POA: Diagnosis not present

## 2017-09-07 ENCOUNTER — Other Ambulatory Visit: Payer: Self-pay | Admitting: Family Medicine

## 2017-09-07 DIAGNOSIS — Z139 Encounter for screening, unspecified: Secondary | ICD-10-CM

## 2017-09-15 ENCOUNTER — Ambulatory Visit (INDEPENDENT_AMBULATORY_CARE_PROVIDER_SITE_OTHER): Payer: Medicare Other | Admitting: Family Medicine

## 2017-09-15 ENCOUNTER — Encounter: Payer: Self-pay | Admitting: Family Medicine

## 2017-09-15 VITALS — BP 120/64 | HR 58 | Temp 98.1°F | Ht 61.0 in | Wt 110.6 lb

## 2017-09-15 DIAGNOSIS — G43009 Migraine without aura, not intractable, without status migrainosus: Secondary | ICD-10-CM

## 2017-09-15 DIAGNOSIS — R42 Dizziness and giddiness: Secondary | ICD-10-CM

## 2017-09-15 DIAGNOSIS — F5101 Primary insomnia: Secondary | ICD-10-CM

## 2017-09-15 DIAGNOSIS — E782 Mixed hyperlipidemia: Secondary | ICD-10-CM

## 2017-09-15 DIAGNOSIS — K219 Gastro-esophageal reflux disease without esophagitis: Secondary | ICD-10-CM | POA: Diagnosis not present

## 2017-09-15 DIAGNOSIS — G4700 Insomnia, unspecified: Secondary | ICD-10-CM | POA: Insufficient documentation

## 2017-09-15 LAB — LIPID PANEL
CHOLESTEROL: 209 mg/dL — AB (ref 0–200)
HDL: 43.7 mg/dL (ref >39.00)
LDL CALC: 133 mg/dL — AB (ref 0–99)
NonHDL: 165.54
Total CHOL/HDL Ratio: 5
Triglycerides: 163 mg/dL — ABNORMAL HIGH (ref 0.0–149.0)
VLDL: 32.6 mg/dL (ref 0.0–40.0)

## 2017-09-15 LAB — BASIC METABOLIC PANEL
BUN: 18 mg/dL (ref 6–23)
CHLORIDE: 106 meq/L (ref 96–112)
CO2: 28 meq/L (ref 19–32)
Calcium: 9.5 mg/dL (ref 8.4–10.5)
Creatinine, Ser: 0.62 mg/dL (ref 0.40–1.20)
GFR: 99.23 mL/min (ref >60.00)
GLUCOSE: 89 mg/dL (ref 70–99)
POTASSIUM: 4.2 meq/L (ref 3.5–5.1)
SODIUM: 142 meq/L (ref 135–145)

## 2017-09-15 LAB — CBC WITH DIFFERENTIAL/PLATELET
BASOS ABS: 0 10*3/uL (ref 0.0–0.1)
Basophils Relative: 0.5 % (ref 0.0–3.0)
EOS ABS: 0.1 10*3/uL (ref 0.0–0.7)
Eosinophils Relative: 1.3 % (ref 0.0–5.0)
HEMATOCRIT: 39.5 % (ref 36.0–46.0)
Hemoglobin: 13.3 g/dL (ref 12.0–15.0)
LYMPHS ABS: 1 10*3/uL (ref 0.7–4.0)
LYMPHS PCT: 17.2 % (ref 12.0–46.0)
MCHC: 33.5 g/dL (ref 30.0–36.0)
MCV: 83.8 fl (ref 78.0–100.0)
Monocytes Absolute: 0.4 10*3/uL (ref 0.1–1.0)
Monocytes Relative: 7 % (ref 3.0–12.0)
NEUTROS ABS: 4.3 10*3/uL (ref 1.4–7.7)
Neutrophils Relative %: 74 % (ref 43.0–77.0)
PLATELETS: 223 10*3/uL (ref 150.0–400.0)
RBC: 4.72 Mil/uL (ref 3.87–5.11)
RDW: 14.4 % (ref 11.5–15.5)
WBC: 5.7 10*3/uL (ref 4.0–10.5)

## 2017-09-15 LAB — POC URINALSYSI DIPSTICK (AUTOMATED)
BILIRUBIN UA: NEGATIVE
Glucose, UA: NEGATIVE
KETONES UA: NEGATIVE
Nitrite, UA: NEGATIVE
PH UA: 5.5 (ref 5.0–8.0)
PROTEIN UA: NEGATIVE
Urobilinogen, UA: 0.2 E.U./dL

## 2017-09-15 LAB — HEPATIC FUNCTION PANEL
ALBUMIN: 4.4 g/dL (ref 3.5–5.2)
ALK PHOS: 73 U/L (ref 39–117)
ALT: 18 U/L (ref 0–35)
AST: 20 U/L (ref 0–37)
Bilirubin, Direct: 0.2 mg/dL (ref 0.0–0.3)
TOTAL PROTEIN: 7.3 g/dL (ref 6.0–8.3)
Total Bilirubin: 1.4 mg/dL — ABNORMAL HIGH (ref 0.2–1.2)

## 2017-09-15 LAB — TSH: TSH: 1.86 u[IU]/mL (ref 0.35–4.50)

## 2017-09-15 MED ORDER — SUMATRIPTAN SUCCINATE 50 MG PO TABS: 50.0000 mg | ORAL_TABLET | ORAL | 5 refills | Status: AC | PRN

## 2017-09-15 NOTE — Progress Notes (Signed)
   Subjective:    Patient ID: Barbara Pittman, female    DOB: Nov 06, 1940, 77 y.o.   MRN: [id number]  HPI Here to follow up on issues. She feels well. Her headaches are well controlled. Her GERD is controlled with diet. She sleeps well using Melatonin 10 mg qhs. She is active and she enjoys Zumba classes at her [employer]. Her DEXA last September showed mild stable osteoporosis.     Review of Systems  Constitutional: Negative.   HENT: Negative.   Eyes: Negative.   Respiratory: Negative.   Cardiovascular: Negative.   Gastrointestinal: Negative.   Genitourinary: Negative for decreased urine volume, difficulty urinating, dyspareunia, dysuria, enuresis, flank pain, frequency, hematuria, pelvic pain and urgency.  Musculoskeletal: Negative.   Skin: Negative.   Neurological: Negative.   Psychiatric/Behavioral: Negative.        Objective:   Physical Exam  Constitutional: She is oriented to person, place, and time. She appears well-developed and well-nourished. No distress.  HENT:  Head: Normocephalic and atraumatic.  Right Ear: External ear normal.  Left Ear: External ear normal.  Nose: Nose normal.  Mouth/Throat: Oropharynx is clear and moist. No oropharyngeal exudate.  Eyes: Pupils are equal, round, and reactive to light. Conjunctivae and EOM are normal. No scleral icterus.  Neck: Normal range of motion. Neck supple. No JVD present. No thyromegaly present.  Cardiovascular: Normal rate, regular rhythm, normal heart sounds and intact distal pulses. Exam reveals no gallop and no friction rub.  No murmur heard. Pulmonary/Chest: Effort normal and breath sounds normal. No respiratory distress. She has no wheezes. She has no rales. She exhibits no tenderness.  Abdominal: Soft. Bowel sounds are normal. She exhibits no distension and no mass. There is no tenderness. There is no rebound and no guarding.  Musculoskeletal: Normal range of motion. She exhibits no edema or tenderness.  Lymphadenopathy:      She has no cervical adenopathy.  Neurological: She is alert and oriented to person, place, and time. She has normal reflexes. No cranial nerve deficit. She exhibits normal muscle tone. Coordination normal.  Skin: Skin is warm and dry. No rash noted. No erythema.  Psychiatric: She has a normal mood and affect. Her behavior is normal. Judgment and thought content normal.          Assessment & Plan:  Her migraines and GERD and insomnia are stable. She will get fasting labs to check her lipids, etc.  Alysia Penna, MD

## 2017-09-18 DIAGNOSIS — H903 Sensorineural hearing loss, bilateral: Secondary | ICD-10-CM | POA: Diagnosis not present

## 2017-10-18 ENCOUNTER — Other Ambulatory Visit: Payer: Self-pay | Admitting: Family Medicine

## 2017-10-18 ENCOUNTER — Ambulatory Visit
Admission: RE | Admit: 2017-10-18 | Discharge: 2017-10-18 | Disposition: A | Discharge status: Home / Self Care | Payer: Medicare Other | Source: Ambulatory Visit | Attending: Family Medicine | Admitting: Family Medicine

## 2017-10-18 DIAGNOSIS — Z1231 Encounter for screening mammogram for malignant neoplasm of breast: Secondary | ICD-10-CM | POA: Diagnosis not present

## 2017-10-18 DIAGNOSIS — Z139 Encounter for screening, unspecified: Secondary | ICD-10-CM

## 2018-01-26 ENCOUNTER — Other Ambulatory Visit: Payer: Self-pay

## 2018-03-23 ENCOUNTER — Ambulatory Visit: Payer: Medicare Other

## 2018-03-26 ENCOUNTER — Ambulatory Visit: Payer: Medicare Other

## 2018-03-26 ENCOUNTER — Encounter: Payer: Self-pay | Admitting: Family Medicine

## 2018-03-26 ENCOUNTER — Ambulatory Visit (INDEPENDENT_AMBULATORY_CARE_PROVIDER_SITE_OTHER): Payer: Medicare Other | Admitting: Family Medicine

## 2018-03-26 VITALS — BP 106/62 | HR 62 | Temp 98.7°F | Ht 61.0 in | Wt 110.2 lb

## 2018-03-26 DIAGNOSIS — R300 Dysuria: Secondary | ICD-10-CM | POA: Diagnosis not present

## 2018-03-26 DIAGNOSIS — D649 Anemia, unspecified: Secondary | ICD-10-CM

## 2018-03-26 DIAGNOSIS — K219 Gastro-esophageal reflux disease without esophagitis: Secondary | ICD-10-CM | POA: Diagnosis not present

## 2018-03-26 DIAGNOSIS — E782 Mixed hyperlipidemia: Secondary | ICD-10-CM

## 2018-03-26 DIAGNOSIS — G43001 Migraine without aura, not intractable, with status migrainosus: Secondary | ICD-10-CM | POA: Diagnosis not present

## 2018-03-26 DIAGNOSIS — F5101 Primary insomnia: Secondary | ICD-10-CM

## 2018-03-26 DIAGNOSIS — Z23 Encounter for immunization: Secondary | ICD-10-CM

## 2018-03-26 LAB — POCT URINALYSIS DIPSTICK
Bilirubin, UA: NEGATIVE
GLUCOSE UA: NEGATIVE
Ketones, UA: NEGATIVE
LEUKOCYTES UA: NEGATIVE
Nitrite, UA: NEGATIVE
Protein, UA: NEGATIVE
RBC UA: POSITIVE
SPEC GRAV UA: 1.01 (ref 1.010–1.025)
UROBILINOGEN UA: 0.2 U/dL
pH, UA: 6 (ref 5.0–8.0)

## 2018-03-26 NOTE — Progress Notes (Signed)
   Subjective:    Patient ID: Barbara Pittman, female    DOB: 1941-06-08, 77 y.o.   MRN: [id number]  HPI Here to follow up on issues. She asks [place] to check her urine to see if she has an infection. She denies any burning or urgency, but she sometimes just doesn't "feel right". She cannot be more specific. No respiratory complaints. Her bowels move regularly. Her migraines are infrequent and Imitrex works well when she does have one. She had a normal mammogram in April. She sleeps well and her appetite is good.   Review of Systems  Constitutional: Negative.   HENT: Negative.   Eyes: Negative.   Respiratory: Negative.   Cardiovascular: Negative.   Gastrointestinal: Negative.   Genitourinary: Negative for decreased urine volume, difficulty urinating, dyspareunia, dysuria, enuresis, flank pain, frequency, hematuria, pelvic pain and urgency.  Musculoskeletal: Negative.   Skin: Negative.   Neurological: Negative.   Psychiatric/Behavioral: Negative.        Objective:   Physical Exam  Constitutional: She is oriented to person, place, and time. She appears well-developed and well-nourished. No distress.  HENT:  Head: Normocephalic and atraumatic.  Right Ear: External ear normal.  Left Ear: External ear normal.  Nose: Nose normal.  Mouth/Throat: Oropharynx is clear and moist. No oropharyngeal exudate.  Eyes: Pupils are equal, round, and reactive to light. Conjunctivae and EOM are normal. No scleral icterus.  Neck: Normal range of motion. Neck supple. No JVD present. No thyromegaly present.  Cardiovascular: Normal rate, regular rhythm, normal heart sounds and intact distal pulses. Exam reveals no gallop and no friction rub.  No murmur heard. Pulmonary/Chest: Effort normal and breath sounds normal. No respiratory distress. She has no wheezes. She has no rales. She exhibits no tenderness.  Abdominal: Soft. Bowel sounds are normal. She exhibits no distension and no mass. There is no tenderness.  There is no rebound and no guarding.  Musculoskeletal: Normal range of motion. She exhibits no edema or tenderness.  Lymphadenopathy:    She has no cervical adenopathy.  Neurological: She is alert and oriented to person, place, and time. She has normal reflexes. She displays normal reflexes. No cranial nerve deficit. She exhibits normal muscle tone. Coordination normal.  Skin: Skin is warm and dry. No rash noted. No erythema.  Psychiatric: She has a normal mood and affect. Her behavior is normal. Judgment and thought content normal.          Assessment & Plan:  She seems to be doing well. Her UA is clear but we will culture the sample to rule out infection. Her migraines are stable. We will set up fasting labs to check her lipids, Hgb, etc soon.  Alysia Penna, MD

## 2018-03-26 NOTE — Addendum Note (Signed)
Addended by: Elie Confer on: 03/26/2018 03:58 PM   Modules accepted: Orders

## 2018-03-27 LAB — TSH: TSH: 1.65 u[IU]/mL (ref 0.35–4.50)

## 2018-03-27 LAB — CBC WITH DIFFERENTIAL/PLATELET
Basophils Absolute: 0 10*3/uL (ref 0.0–0.1)
Basophils Relative: 0.6 % (ref 0.0–3.0)
Eosinophils Absolute: 0.1 10*3/uL (ref 0.0–0.7)
Eosinophils Relative: 1.1 % (ref 0.0–5.0)
HCT: 37.7 % (ref 36.0–46.0)
Hemoglobin: 12.5 g/dL (ref 12.0–15.0)
LYMPHS ABS: 0.9 10*3/uL (ref 0.7–4.0)
Lymphocytes Relative: 17.2 % (ref 12.0–46.0)
MCHC: 33.2 g/dL (ref 30.0–36.0)
MCV: 82.8 fl (ref 78.0–100.0)
MONOS PCT: 8.1 % (ref 3.0–12.0)
Monocytes Absolute: 0.4 10*3/uL (ref 0.1–1.0)
NEUTROS PCT: 73 % (ref 43.0–77.0)
Neutro Abs: 3.9 10*3/uL (ref 1.4–7.7)
Platelets: 210 10*3/uL (ref 150.0–400.0)
RBC: 4.55 Mil/uL (ref 3.87–5.11)
RDW: 14.4 % (ref 11.5–15.5)
WBC: 5.4 10*3/uL (ref 4.0–10.5)

## 2018-03-27 LAB — HEPATIC FUNCTION PANEL
ALK PHOS: 68 U/L (ref 39–117)
ALT: 10 U/L (ref 0–35)
AST: 15 U/L (ref 0–37)
Albumin: 4.3 g/dL (ref 3.5–5.2)
BILIRUBIN DIRECT: 0.2 mg/dL (ref 0.0–0.3)
TOTAL PROTEIN: 6.7 g/dL (ref 6.0–8.3)
Total Bilirubin: 1.9 mg/dL — ABNORMAL HIGH (ref 0.2–1.2)

## 2018-03-27 LAB — URINE CULTURE
MICRO NUMBER: 91171320
RESULT: NO GROWTH
SPECIMEN QUALITY:: ADEQUATE

## 2018-03-27 LAB — BASIC METABOLIC PANEL
BUN: 15 mg/dL (ref 6–23)
CHLORIDE: 106 meq/L (ref 96–112)
CO2: 29 meq/L (ref 19–32)
CREATININE: 0.7 mg/dL (ref 0.40–1.20)
Calcium: 9.3 mg/dL (ref 8.4–10.5)
GFR: 86.14 mL/min (ref >60.00)
Glucose, Bld: 92 mg/dL (ref 70–99)
Potassium: 4.3 mEq/L (ref 3.5–5.1)
Sodium: 142 mEq/L (ref 135–145)

## 2018-03-27 LAB — LIPID PANEL
Cholesterol: 180 mg/dL (ref 0–200)
HDL: 37.4 mg/dL — ABNORMAL LOW (ref >39.00)
LDL CALC: 121 mg/dL — AB (ref 0–99)
NONHDL: 142.75
Total CHOL/HDL Ratio: 5
Triglycerides: 107 mg/dL (ref 0.0–149.0)
VLDL: 21.4 mg/dL (ref 0.0–40.0)

## 2018-03-27 NOTE — Addendum Note (Signed)
Addended by: Elmer Picker on: 03/27/2018 07:43 AM   Modules accepted: Orders

## 2018-04-02 ENCOUNTER — Ambulatory Visit (INDEPENDENT_AMBULATORY_CARE_PROVIDER_SITE_OTHER): Payer: Medicare Other | Admitting: Family Medicine

## 2018-04-02 ENCOUNTER — Encounter: Payer: Self-pay | Admitting: Family Medicine

## 2018-04-02 VITALS — BP 120/62 | HR 85 | Temp 98.4°F | Wt 110.5 lb

## 2018-04-02 DIAGNOSIS — L989 Disorder of the skin and subcutaneous tissue, unspecified: Secondary | ICD-10-CM | POA: Diagnosis not present

## 2018-04-02 NOTE — Progress Notes (Signed)
   Subjective:    Patient ID: Barbara Pittman, female    DOB: 1940/10/15, 77 y.o.   MRN: [id number]  HPI Here to discuss some uncomfortable sensations in the pelvic region. These are not painful but are uncomfortable. No urinary or bowel symptoms. No itching or odor. She had labs last week including a UA and a urine culture which was negative.    Review of Systems  Constitutional: Negative.   Respiratory: Negative.   Cardiovascular: Negative.   Genitourinary: Negative for difficulty urinating, dysuria, frequency, genital sores, hematuria, menstrual problem, pelvic pain, urgency, vaginal bleeding and vaginal discharge.       Objective:   Physical Exam  Constitutional: She appears well-developed and well-nourished.  Cardiovascular: Normal rate, regular rhythm, normal heart sounds and intact distal pulses.  Pulmonary/Chest: Effort normal and breath sounds normal.  Abdominal: Soft. Bowel sounds are normal. She exhibits no distension and no mass. There is no tenderness. There is no rebound and no guarding. No hernia.          Assessment & Plan:  Pelvic/perineal discomfort. We will refer her to GYN to evaluate.  Alysia Penna, MD

## 2018-04-13 ENCOUNTER — Ambulatory Visit (INDEPENDENT_AMBULATORY_CARE_PROVIDER_SITE_OTHER): Payer: Medicare Other | Admitting: Obstetrics & Gynecology

## 2018-04-13 ENCOUNTER — Encounter: Payer: Self-pay | Admitting: Obstetrics & Gynecology

## 2018-04-13 VITALS — BP 130/70 | Ht 60.5 in | Wt 111.0 lb

## 2018-04-13 DIAGNOSIS — Z90710 Acquired absence of both cervix and uterus: Secondary | ICD-10-CM

## 2018-04-13 DIAGNOSIS — Z780 Asymptomatic menopausal state: Secondary | ICD-10-CM | POA: Diagnosis not present

## 2018-04-13 DIAGNOSIS — M810 Age-related osteoporosis without current pathological fracture: Secondary | ICD-10-CM | POA: Diagnosis not present

## 2018-04-13 DIAGNOSIS — Z01419 Encounter for gynecological examination (general) (routine) without abnormal findings: Secondary | ICD-10-CM | POA: Diagnosis not present

## 2018-04-13 DIAGNOSIS — Z1272 Encounter for screening for malignant neoplasm of vagina: Secondary | ICD-10-CM | POA: Diagnosis not present

## 2018-04-13 DIAGNOSIS — N9089 Other specified noninflammatory disorders of vulva and perineum: Secondary | ICD-10-CM

## 2018-04-13 NOTE — Addendum Note (Signed)
Addended by: Thurnell Garbe A on: 04/13/2018 12:46 PM   Modules accepted: Orders

## 2018-04-13 NOTE — Patient Instructions (Signed)
1. Encounter for Papanicolaou smear of vagina as part of routine gynecological examination Gynecologic exam status post total hysterectomy and in menopause.  Pap test on the vaginal vault done this year.  Will not repeat in the future if normal.  Breast exam normal.  Screening mammogram was negative in April 2019.  Colonoscopy in 2016.  Health labs with Dr. Sarajane Jews in October 2019.  Body mass index 21.32.  Continue with fitness and healthy nutrition.  2. History of total hysterectomy  3. Postmenopausal Well on no hormone replacement therapy.  4. Age-related osteoporosis without current pathological fracture Osteoporosis on bone density September 2018 with a T score of -2.5.  Patient chose not to be on bone medication at this time.  No history of fragility fracture.  Taking vitamin D supplements, calcium supplements of 1.5 g/day and regular weightbearing physical activity.  Will verify vitamin D today to assure good supplementation.  Recommend repeat bone density September 2020. - VITAMIN D 25 Hydroxy (Vit-D Deficiency, Fractures)  5. Vulvar irritation Resolved spontaneously per patient.  Vulva normal with postmenopausal atrophy, no lesion, no inflammation.  Patient reassured.  Barbara Pittman, it was a pleasure meeting you today!  I will inform you of your results as soon as they are available.

## 2018-04-13 NOTE — Progress Notes (Signed)
Barbara Pittman 1940/11/06 [id number]   History:    77 y.o. G2P2L2 Has grand-children.  RP:  New patient presenting for annual gyn exam   HPI: Postmenopausal, well on no hormone replacement therapy.  Status post total hysterectomy for prolapse.  No pelvic pain.  Abstinent.  No vaginal discharge.  Had intermittent discomfort on the lower vagina/vulva towards the left, but not completely resolved.  Never felt any lesion and did not see any abnormality when looked with a mirror at the vulva.  Urine and bowel movements normal.  Breasts normal.  Physically active with silver Snickers.  Health labs with family physician Dr. Sarajane Jews in October 2019.  Past medical history,surgical history, family history and social history were all reviewed and documented in the EPIC chart.  Gynecologic History No LMP recorded. Patient has had a hysterectomy. Contraception: status post hysterectomy Last Pap: Normal in the past Last mammogram: 09/2017. Results were: Negative Bone Density: 02/2017 Osteoporosis T-Score -2.5.  Not on Bone medication. Colonoscopy: 2016  Obstetric History OB History  Gravida Para Term Preterm AB Living  2 2       2   SAB TAB Ectopic Multiple Live Births               # Outcome Date GA Lbr Len/2nd Weight Sex Delivery Anes PTL Lv  2 Para           1 Para              ROS: A ROS was performed and pertinent positives and negatives are included in the history.  GENERAL: No fevers or chills. HEENT: No change in vision, no earache, sore throat or sinus congestion. NECK: No pain or stiffness. CARDIOVASCULAR: No chest pain or pressure. No palpitations. PULMONARY: No shortness of breath, cough or wheeze. GASTROINTESTINAL: No abdominal pain, nausea, vomiting or diarrhea, melena or bright red blood per rectum. GENITOURINARY: No urinary frequency, urgency, hesitancy or dysuria. MUSCULOSKELETAL: No joint or muscle pain, no back pain, no recent trauma. DERMATOLOGIC: No rash, no itching, no lesions.  ENDOCRINE: No polyuria, polydipsia, no heat or cold intolerance. No recent change in weight. HEMATOLOGICAL: No anemia or easy bruising or bleeding. NEUROLOGIC: No headache, seizures, numbness, tingling or weakness. PSYCHIATRIC: No depression, no loss of interest in normal activity or change in sleep pattern.     Exam:   BP 130/70   Ht 5' 0.5" (1.537 m)   Wt 111 lb (50.3 kg)   BMI 21.32 kg/m   Body mass index is 21.32 kg/m.  General appearance : Well developed well nourished female. No acute distress HEENT: Eyes: no retinal hemorrhage or exudates,  Neck supple, trachea midline, no carotid bruits, no thyroidmegaly Lungs: Clear to auscultation, no rhonchi or wheezes, or rib retractions  Heart: Regular rate and rhythm, no murmurs or gallops Breast:Examined in sitting and supine position were symmetrical in appearance, no palpable masses or tenderness,  no skin retraction, no nipple inversion, no nipple discharge, no skin discoloration, no axillary or supraclavicular lymphadenopathy Abdomen: no palpable masses or tenderness, no rebound or guarding Extremities: no edema or skin discoloration or tenderness  Pelvic: Vulva: Normal             Vagina: No gross lesions or discharge.  Pap reflex done on vaginal vault.  Cervix/Uterus absent  Adnexa  Without masses or tenderness  Anus: Normal   Assessment/Plan:  77 y.o. female for annual exam   1. Encounter for Papanicolaou smear of vagina as part of  routine gynecological examination Gynecologic exam status post total hysterectomy and in menopause.  Pap test on the vaginal vault done this year.  Will not repeat in the future if normal.  Breast exam normal.  Screening mammogram was negative in April 2019.  Colonoscopy in 2016.  Health labs with Dr. Sarajane Jews in October 2019.  Body mass index 21.32.  Continue with fitness and healthy nutrition.  2. History of total hysterectomy  3. Postmenopausal Well on no hormone replacement therapy.  4.  Age-related osteoporosis without current pathological fracture Osteoporosis on bone density September 2018 with a T score of -2.5.  Patient chose not to be on bone medication at this time.  No history of fragility fracture.  Taking vitamin D supplements, calcium supplements of 1.5 g/day and regular weightbearing physical activity.  Will verify vitamin D today to assure good supplementation.  Recommend repeat bone density September 2020. - VITAMIN D 25 Hydroxy (Vit-D Deficiency, Fractures)  5. Vulvar irritation Resolved spontaneously per patient.  Vulva normal with postmenopausal atrophy, no lesion, no inflammation.  Patient reassured.  Counseling on above issues and coordination of care more than 50% for 20 minutes.  Princess Bruins MD, 11:58 AM 04/13/2018

## 2018-04-14 LAB — VITAMIN D 25 HYDROXY (VIT D DEFICIENCY, FRACTURES): Vit D, 25-Hydroxy: 21 ng/mL — ABNORMAL LOW (ref 30–100)

## 2018-04-16 LAB — PAP IG W/ RFLX HPV ASCU

## 2018-04-17 ENCOUNTER — Other Ambulatory Visit: Payer: Self-pay | Admitting: Obstetrics & Gynecology

## 2018-04-17 DIAGNOSIS — E559 Vitamin D deficiency, unspecified: Secondary | ICD-10-CM

## 2018-04-17 MED ORDER — VITAMIN D (ERGOCALCIFEROL) 1.25 MG (50000 UNIT) PO CAPS: 50000.0000 [IU] | ORAL_CAPSULE | ORAL | 0 refills | Status: DC

## 2018-04-24 ENCOUNTER — Telehealth: Payer: Self-pay

## 2018-04-24 NOTE — Telephone Encounter (Signed)
Patient is taking Vitamin D 50,000 units weekly x 12 weeks.  She said you recommend she take Calcium 500 mg daily. She said she cannot find Calcium 500mg  without some added Vitamin D in it.  She assumed she did not need to take the extra Vit D but wondered what to do about the calcium while she is on the Prescription.

## 2018-04-25 NOTE — Telephone Encounter (Signed)
Patient advised.

## 2018-04-25 NOTE — Telephone Encounter (Signed)
Buy the Ca++ with the lowest Vit D added and it will be fine.

## 2018-04-27 DIAGNOSIS — Z961 Presence of intraocular lens: Secondary | ICD-10-CM | POA: Diagnosis not present

## 2018-07-18 DIAGNOSIS — H1131 Conjunctival hemorrhage, right eye: Secondary | ICD-10-CM | POA: Diagnosis not present

## 2018-07-19 ENCOUNTER — Other Ambulatory Visit: Payer: Medicare Other

## 2018-07-19 DIAGNOSIS — E559 Vitamin D deficiency, unspecified: Secondary | ICD-10-CM | POA: Diagnosis not present

## 2018-07-20 LAB — VITAMIN D 25 HYDROXY (VIT D DEFICIENCY, FRACTURES): VIT D 25 HYDROXY: 52 ng/mL (ref 30–100)

## 2018-09-07 ENCOUNTER — Other Ambulatory Visit: Payer: Self-pay | Admitting: Family Medicine

## 2018-09-07 DIAGNOSIS — Z1231 Encounter for screening mammogram for malignant neoplasm of breast: Secondary | ICD-10-CM

## 2018-09-24 ENCOUNTER — Telehealth: Payer: Self-pay

## 2018-09-24 NOTE — Telephone Encounter (Signed)
Author phoned pt. to assess interest in scheduling virtual awv. Pt. Stated she was not interested in doing AWV at all. Noted in roster for careguides to see.

## 2018-10-22 ENCOUNTER — Ambulatory Visit: Payer: Medicare Other

## 2018-12-07 ENCOUNTER — Other Ambulatory Visit: Payer: Self-pay

## 2018-12-07 ENCOUNTER — Ambulatory Visit
Admission: RE | Admit: 2018-12-07 | Discharge: 2018-12-07 | Disposition: A | Discharge status: Home / Self Care | Payer: Medicare Other | Source: Ambulatory Visit | Attending: Family Medicine | Admitting: Family Medicine

## 2018-12-07 DIAGNOSIS — Z1231 Encounter for screening mammogram for malignant neoplasm of breast: Secondary | ICD-10-CM

## 2018-12-26 ENCOUNTER — Encounter: Payer: Self-pay | Admitting: Family Medicine

## 2018-12-26 ENCOUNTER — Ambulatory Visit (INDEPENDENT_AMBULATORY_CARE_PROVIDER_SITE_OTHER): Payer: Medicare Other | Admitting: Family Medicine

## 2018-12-26 ENCOUNTER — Other Ambulatory Visit: Payer: Self-pay

## 2018-12-26 VITALS — BP 120/58 | HR 68 | Temp 98.4°F | Ht 60.5 in | Wt 108.2 lb

## 2018-12-26 DIAGNOSIS — M546 Pain in thoracic spine: Secondary | ICD-10-CM | POA: Diagnosis not present

## 2018-12-26 NOTE — Progress Notes (Signed)
   Subjective:    Patient ID: Barbara Pittman, female    DOB: 09-06-1940, 78 y.o.   MRN: [id number]  HPI Here for one week of mild intermittent pains and a sensation of "something moving" in the middle back. No symptoms down the arms. She has been packing up boxes and getting ready to move. She will be moving to [place], [place] in a few weeks to be near her daughter.    Review of Systems  Constitutional: Negative.   Respiratory: Negative.   Cardiovascular: Negative.   Musculoskeletal: Positive for back pain.       Objective:   Physical Exam Constitutional:      General: She is not in acute distress.    Appearance: Normal appearance.  Neck:     Musculoskeletal: Normal range of motion and neck supple.  Cardiovascular:     Rate and Rhythm: Normal rate and regular rhythm.     Pulses: Normal pulses.     Heart sounds: Normal heart sounds.  Pulmonary:     Effort: Pulmonary effort is normal.     Breath sounds: Normal breath sounds.  Musculoskeletal:     Comments: She is mildly tender to the left of the mid thoracic spine. Full ROM   Neurological:     Mental Status: She is alert.           Assessment & Plan:  She is having some muscle spasms, likely the result of all the bending and stooping and packing she has been doing. She may apply heat to the area. Rest and stretches will help. She declined to take any medication for this. Recheck prn. Alysia Penna, MD

## 2019-03-11 DIAGNOSIS — Z23 Encounter for immunization: Secondary | ICD-10-CM | POA: Diagnosis not present

## 2019-03-28 ENCOUNTER — Encounter: Payer: Medicare Other | Admitting: Family Medicine

## 2019-04-03 DIAGNOSIS — H26493 Other secondary cataract, bilateral: Secondary | ICD-10-CM | POA: Diagnosis not present

## 2019-04-03 DIAGNOSIS — H04121 Dry eye syndrome of right lacrimal gland: Secondary | ICD-10-CM | POA: Diagnosis not present

## 2019-04-03 DIAGNOSIS — H04122 Dry eye syndrome of left lacrimal gland: Secondary | ICD-10-CM | POA: Diagnosis not present

## 2019-04-03 DIAGNOSIS — D3131 Benign neoplasm of right choroid: Secondary | ICD-10-CM | POA: Diagnosis not present

## 2019-04-03 DIAGNOSIS — H35033 Hypertensive retinopathy, bilateral: Secondary | ICD-10-CM | POA: Diagnosis not present

## 2019-04-03 DIAGNOSIS — H04123 Dry eye syndrome of bilateral lacrimal glands: Secondary | ICD-10-CM | POA: Diagnosis not present

## 2019-04-03 DIAGNOSIS — H35363 Drusen (degenerative) of macula, bilateral: Secondary | ICD-10-CM | POA: Diagnosis not present

## 2019-05-08 DIAGNOSIS — E559 Vitamin D deficiency, unspecified: Secondary | ICD-10-CM | POA: Diagnosis not present

## 2019-05-08 DIAGNOSIS — E785 Hyperlipidemia, unspecified: Secondary | ICD-10-CM | POA: Diagnosis not present

## 2019-05-08 DIAGNOSIS — Z0000 Encounter for general adult medical examination without abnormal findings: Secondary | ICD-10-CM | POA: Diagnosis not present

## 2019-06-10 ENCOUNTER — Telehealth: Payer: Self-pay | Admitting: Family Medicine

## 2019-06-10 NOTE — Telephone Encounter (Signed)
Not able to leave a message.   [facility] faxed over a medical record release form and it is filled out in correctly. We have been trying to re-fax to send and there is no answer.   Is the patient needing records from Dr. Sarajane Jews sent [facility]?  The form that we received looks like they are wanting records sent to Dr. Sarajane Jews.

## 2019-06-10 NOTE — Telephone Encounter (Signed)
I called [facility] and was given the correct fax number and re-sent the form back to them and they received the form.

## 2019-08-29 DIAGNOSIS — Z23 Encounter for immunization: Secondary | ICD-10-CM | POA: Diagnosis not present

## 2019-11-04 IMAGING — MG DIGITAL SCREENING BILATERAL MAMMOGRAM WITH TOMO AND CAD
8 series · 9 of 24 positions shown · non-contrast
Comparison: Previous exam(s).

CLINICAL DATA: Screening.

EXAM:
DIGITAL SCREENING BILATERAL MAMMOGRAM WITH TOMO AND CAD

[L CC synth-2D]
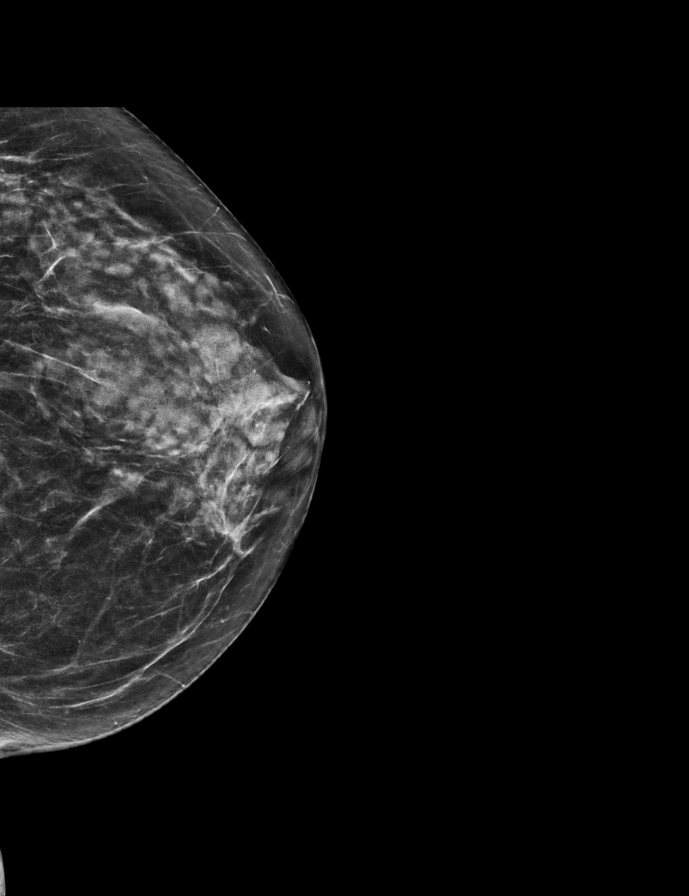

[R MLO synth-2D]
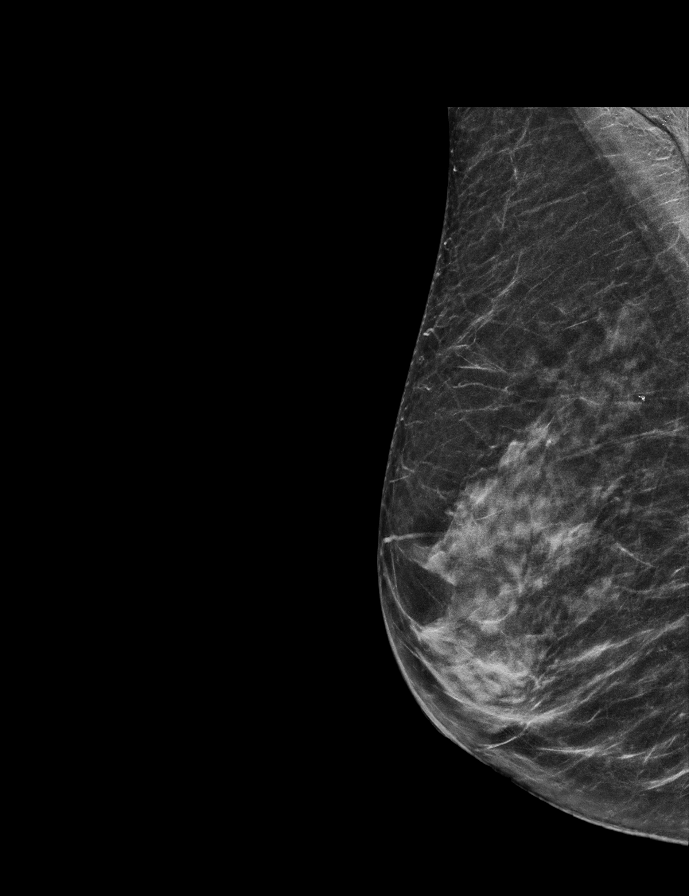

[L MLO synth-2D]
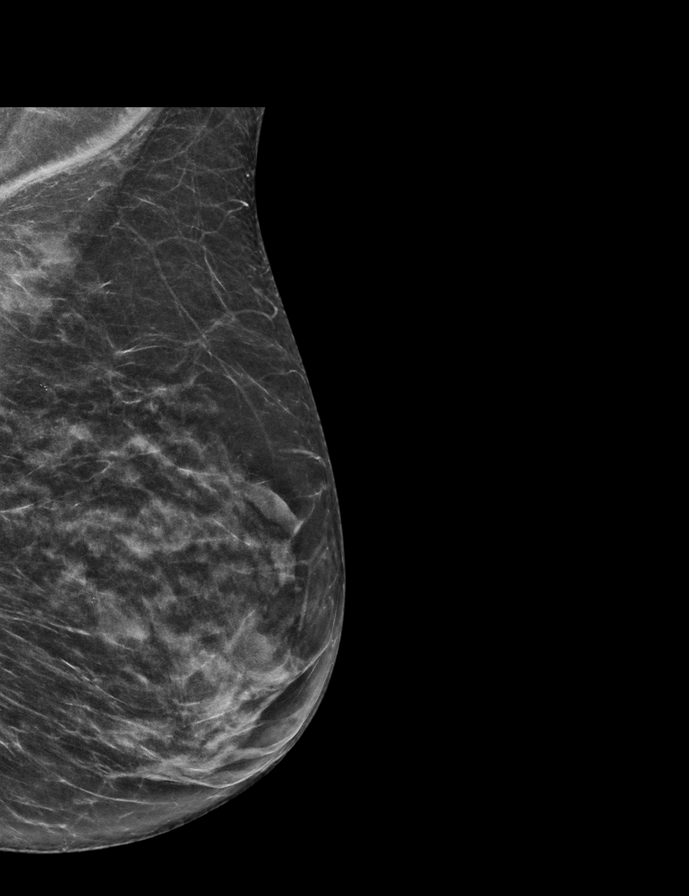

[R CC synth-2D]
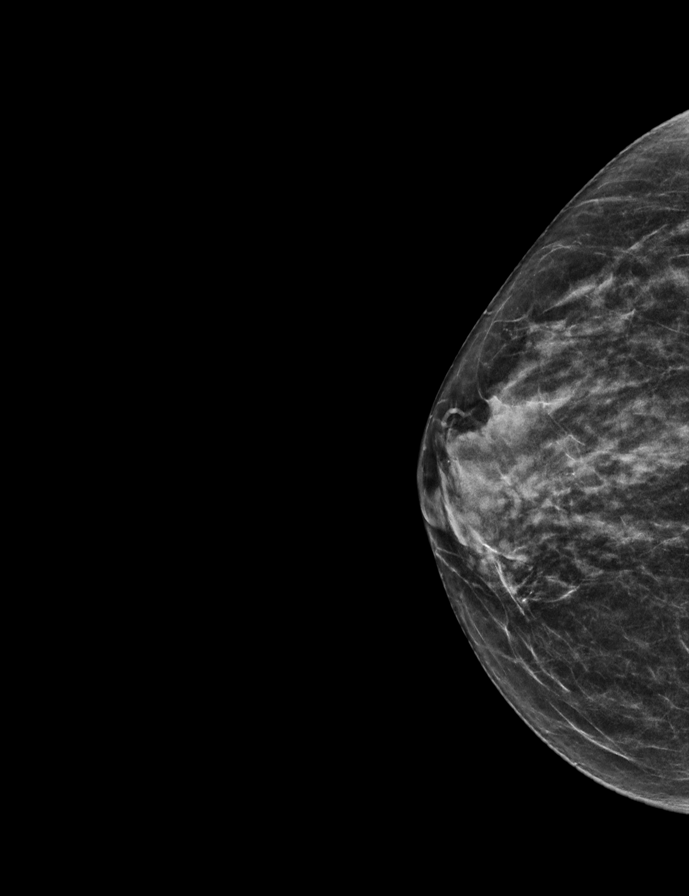

[L CC tomo · 2 of 52 frames shown]
[frame 17/52]
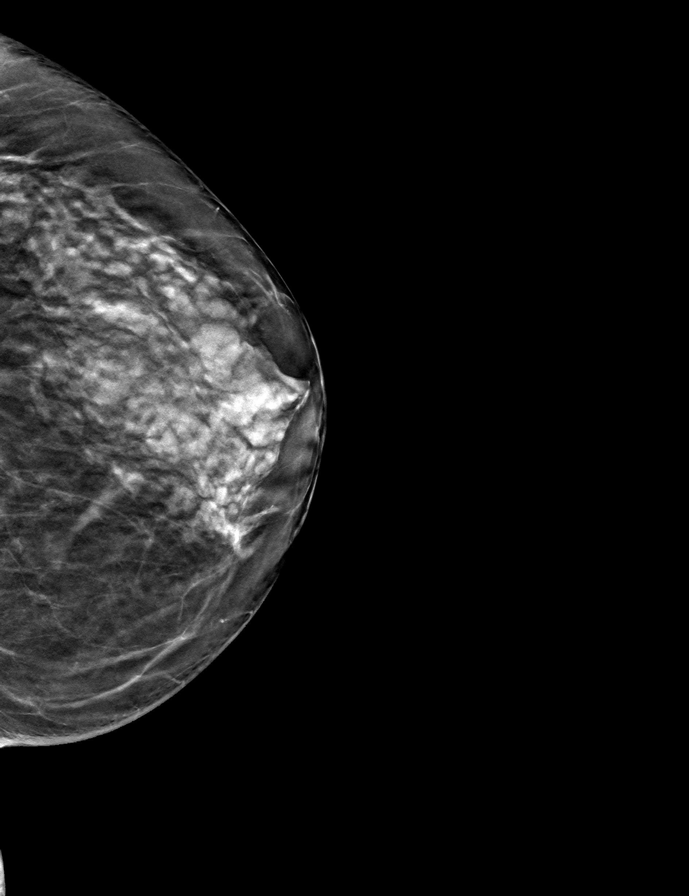
[frame 27/52]
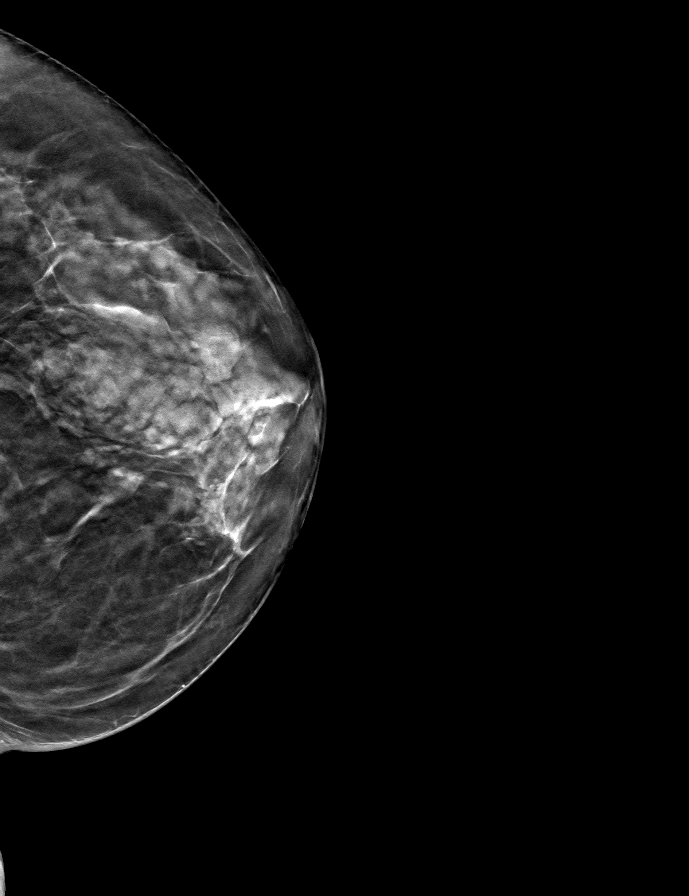

[R CC tomo · tomo slice 25/49.0]
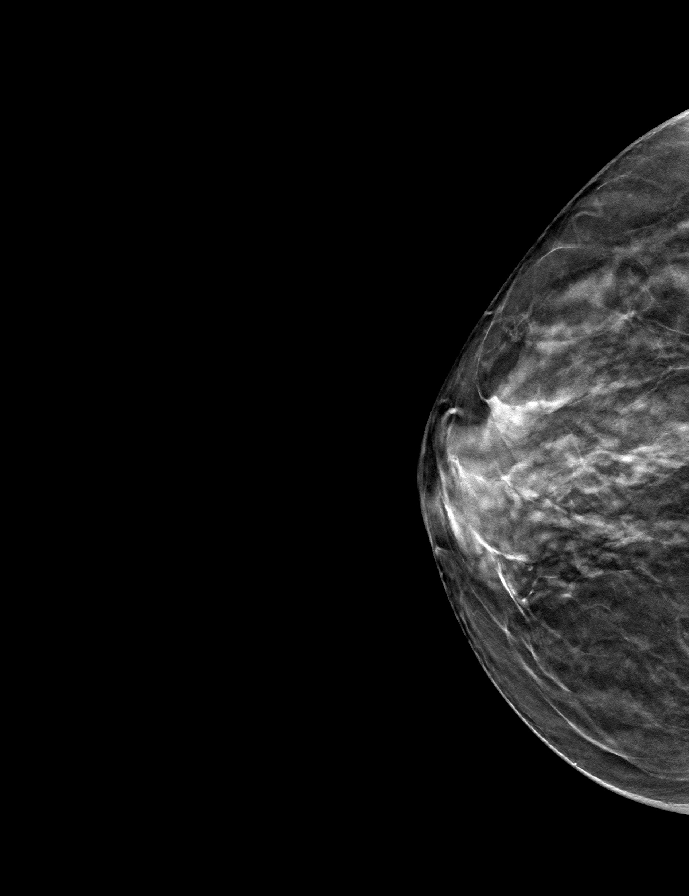

[L MLO tomo · tomo slice 28/55.0]
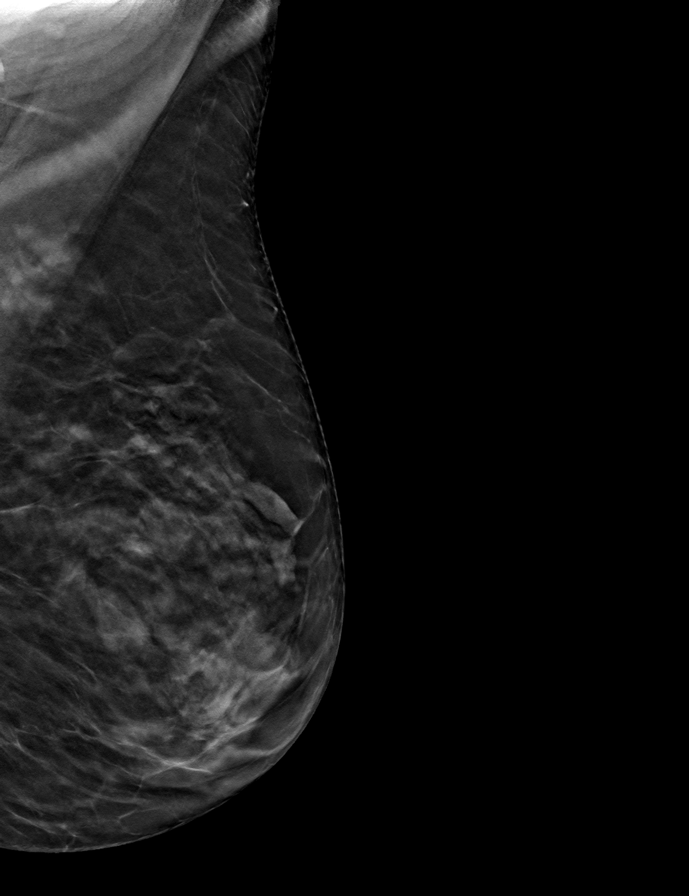

[R MLO tomo · tomo slice 27/54.0]
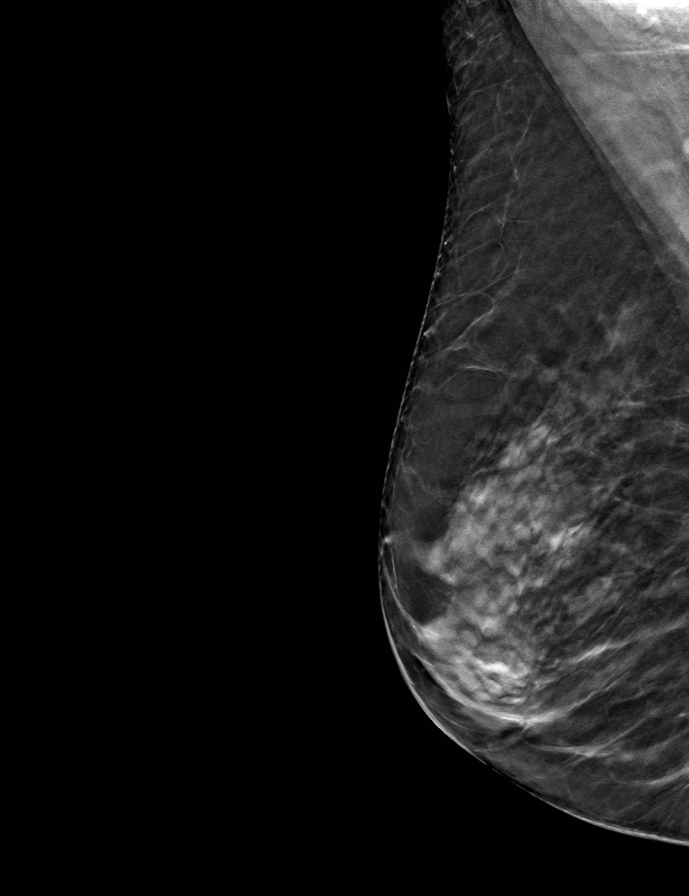

[9 of 24 positions shown; findings below may reference images not displayed]

ACR Breast Density Category c: The breast tissue is heterogeneously
dense, which may obscure small masses.
FINDINGS: There are no findings suspicious for malignancy. Images were
processed with CAD.
IMPRESSION: No mammographic evidence of malignancy. A result letter of this
screening mammogram will be mailed directly to the patient.

RECOMMENDATION:
Screening mammogram in one year. (Code:FT-U-LHB)

BI-RADS CATEGORY  1: Negative.
